# Patient Record
Sex: Female | Born: 1935 | Race: White | Hispanic: No | Marital: Married | Smoking: Never smoker
Health system: Southern US, Community
[De-identification: ages and names within clinical notes are randomized; demographics above are authoritative.]

## PROBLEM LIST (undated history)

## (undated) DIAGNOSIS — E039 Hypothyroidism, unspecified: Secondary | ICD-10-CM

## (undated) DIAGNOSIS — J45909 Unspecified asthma, uncomplicated: Secondary | ICD-10-CM

## (undated) DIAGNOSIS — F0280 Dementia in other diseases classified elsewhere without behavioral disturbance: Secondary | ICD-10-CM

## (undated) DIAGNOSIS — R079 Chest pain, unspecified: Secondary | ICD-10-CM

## (undated) DIAGNOSIS — E785 Hyperlipidemia, unspecified: Secondary | ICD-10-CM

## (undated) HISTORY — PX: ABDOMINAL HYSTERECTOMY: SHX81

## (undated) HISTORY — PX: TONSILLECTOMY: SUR1361

## (undated) HISTORY — DX: Hyperlipidemia, unspecified: E78.5

## (undated) HISTORY — DX: Hypothyroidism, unspecified: E03.9

## (undated) HISTORY — DX: Dementia in other diseases classified elsewhere, unspecified severity, without behavioral disturbance, psychotic disturbance, mood disturbance, and anxiety: F02.80

## (undated) HISTORY — DX: Chest pain, unspecified: R07.9

## (undated) HISTORY — DX: Unspecified asthma, uncomplicated: J45.909

---

## 2004-08-24 ENCOUNTER — Ambulatory Visit: Payer: Self-pay | Admitting: Family Medicine

## 2004-09-18 ENCOUNTER — Ambulatory Visit: Payer: Self-pay | Admitting: Family Medicine

## 2005-03-05 ENCOUNTER — Ambulatory Visit: Payer: Self-pay | Admitting: Family Medicine

## 2005-08-14 ENCOUNTER — Ambulatory Visit: Payer: Self-pay | Admitting: Family Medicine

## 2005-09-10 ENCOUNTER — Ambulatory Visit: Payer: Self-pay | Admitting: Family Medicine

## 2005-10-30 ENCOUNTER — Ambulatory Visit: Payer: Self-pay | Admitting: Family Medicine

## 2016-03-16 DIAGNOSIS — M810 Age-related osteoporosis without current pathological fracture: Secondary | ICD-10-CM

## 2016-03-16 DIAGNOSIS — E782 Mixed hyperlipidemia: Secondary | ICD-10-CM

## 2016-03-16 DIAGNOSIS — F0390 Unspecified dementia without behavioral disturbance: Secondary | ICD-10-CM

## 2016-03-16 DIAGNOSIS — K219 Gastro-esophageal reflux disease without esophagitis: Secondary | ICD-10-CM

## 2016-03-16 DIAGNOSIS — J45909 Unspecified asthma, uncomplicated: Secondary | ICD-10-CM

## 2016-03-16 HISTORY — DX: Age-related osteoporosis without current pathological fracture: M81.0

## 2016-03-16 HISTORY — DX: Mixed hyperlipidemia: E78.2

## 2016-03-16 HISTORY — DX: Unspecified dementia, unspecified severity, without behavioral disturbance, psychotic disturbance, mood disturbance, and anxiety: F03.90

## 2016-03-16 HISTORY — DX: Unspecified asthma, uncomplicated: J45.909

## 2016-03-16 HISTORY — DX: Gastro-esophageal reflux disease without esophagitis: K21.9

## 2016-06-05 DIAGNOSIS — E039 Hypothyroidism, unspecified: Secondary | ICD-10-CM

## 2016-06-05 HISTORY — DX: Hypothyroidism, unspecified: E03.9

## 2016-08-13 DIAGNOSIS — E559 Vitamin D deficiency, unspecified: Secondary | ICD-10-CM | POA: Insufficient documentation

## 2016-08-13 HISTORY — DX: Vitamin D deficiency, unspecified: E55.9

## 2017-06-10 DIAGNOSIS — R079 Chest pain, unspecified: Secondary | ICD-10-CM | POA: Diagnosis not present

## 2018-04-07 DIAGNOSIS — Z131 Encounter for screening for diabetes mellitus: Secondary | ICD-10-CM

## 2018-04-07 HISTORY — DX: Encounter for screening for diabetes mellitus: Z13.1

## 2018-10-13 DIAGNOSIS — Z8249 Family history of ischemic heart disease and other diseases of the circulatory system: Secondary | ICD-10-CM

## 2018-10-13 HISTORY — DX: Family history of ischemic heart disease and other diseases of the circulatory system: Z82.49

## 2019-05-05 DIAGNOSIS — R079 Chest pain, unspecified: Secondary | ICD-10-CM

## 2019-05-05 DIAGNOSIS — G309 Alzheimer's disease, unspecified: Secondary | ICD-10-CM

## 2019-05-05 DIAGNOSIS — K219 Gastro-esophageal reflux disease without esophagitis: Secondary | ICD-10-CM

## 2019-05-05 DIAGNOSIS — E039 Hypothyroidism, unspecified: Secondary | ICD-10-CM

## 2019-05-05 DIAGNOSIS — E785 Hyperlipidemia, unspecified: Secondary | ICD-10-CM | POA: Diagnosis not present

## 2019-05-05 DIAGNOSIS — R9439 Abnormal result of other cardiovascular function study: Secondary | ICD-10-CM | POA: Diagnosis not present

## 2019-05-06 DIAGNOSIS — F419 Anxiety disorder, unspecified: Secondary | ICD-10-CM

## 2019-05-06 DIAGNOSIS — G309 Alzheimer's disease, unspecified: Secondary | ICD-10-CM | POA: Diagnosis not present

## 2019-05-06 DIAGNOSIS — R079 Chest pain, unspecified: Secondary | ICD-10-CM | POA: Diagnosis not present

## 2019-05-06 DIAGNOSIS — K219 Gastro-esophageal reflux disease without esophagitis: Secondary | ICD-10-CM | POA: Diagnosis not present

## 2019-06-03 ENCOUNTER — Ambulatory Visit (INDEPENDENT_AMBULATORY_CARE_PROVIDER_SITE_OTHER): Payer: Medicare HMO | Admitting: Cardiology

## 2019-06-03 ENCOUNTER — Encounter: Payer: Self-pay | Admitting: Cardiology

## 2019-06-03 ENCOUNTER — Other Ambulatory Visit: Payer: Self-pay

## 2019-06-03 VITALS — BP 142/68 | HR 78 | Temp 97.2°F | Ht 64.0 in | Wt 134.4 lb

## 2019-06-03 DIAGNOSIS — I10 Essential (primary) hypertension: Secondary | ICD-10-CM

## 2019-06-03 DIAGNOSIS — E782 Mixed hyperlipidemia: Secondary | ICD-10-CM

## 2019-06-03 DIAGNOSIS — R9439 Abnormal result of other cardiovascular function study: Secondary | ICD-10-CM

## 2019-06-03 DIAGNOSIS — F0390 Unspecified dementia without behavioral disturbance: Secondary | ICD-10-CM | POA: Diagnosis not present

## 2019-06-03 HISTORY — DX: Essential (primary) hypertension: I10

## 2019-06-03 HISTORY — DX: Abnormal result of other cardiovascular function study: R94.39

## 2019-06-03 HISTORY — DX: Mixed hyperlipidemia: E78.2

## 2019-06-03 MED ORDER — ESCITALOPRAM OXALATE 5 MG PO TABS: 5.0000 mg | ORAL_TABLET | Freq: Every day | ORAL | 1 refills | Status: DC

## 2019-06-03 MED ORDER — ATORVASTATIN CALCIUM 40 MG PO TABS: 40.0000 mg | ORAL_TABLET | Freq: Every day | ORAL | 1 refills | Status: DC

## 2019-06-03 MED ORDER — RANOLAZINE ER 500 MG PO TB12: 500.0000 mg | ORAL_TABLET | Freq: Two times a day (BID) | ORAL | 1 refills | Status: DC

## 2019-06-03 NOTE — Progress Notes (Signed)
Cardiology Office Note:    Date:  06/03/2019   ID:  Megan Everett, DOB 08/06/1928, MRN [id number]  PCP:  Olive Bassough, Robert L, MD  Cardiologist:  Garwin Brothersajan R Rommel Hogston, MD   Referring MD: Olive Bassough, Robert L, MD    ASSESSMENT:    1. Abnormal nuclear stress test   2. Essential hypertension   3. Mixed dyslipidemia   4. Dementia without behavioral disturbance, unspecified dementia type (HCC)    PLAN:    In order of problems listed above:  1. Abnormal nuclear stress test: I agree with the fact that she will continue medical therapy for this finding.  She has no other symptoms.  She denies any chest pain.  She had blood work by her primary care physician.  She recently got treated again for pneumonitis. 2. Essential hypertension: Blood pressure stable 3. Mixed dyslipidemia: Diet followed and lipids followed by primary care physician.  She is on statin therapy. 4. Patient will be seen in follow-up appointment in 9 months or earlier if the patient has any concerns    Medication Adjustments/Labs and Tests Ordered: Current medicines are reviewed at length with the patient today.  Concerns regarding medicines are outlined above.  No orders of the defined types were placed in this encounter.  No orders of the defined types were placed in this encounter.    No chief complaint on file.    History of Present Illness:    Megan MartiniMildred B Hazzard is a 85 y.o. female.  Patient is accompanied by family member.  Patient has history of hyperlipidemia.  She has Alzheimer's dementia.  She very pleasant lady.  She came into the hospital because of chest pain and a stress test was mildly abnormal and appropriately medical therapy was recommended.  Subsequently she is done fine.  No chest pain orthopnea or PND.  Her primary care physician does have blood work.  At the time of my evaluation, the patient is alert awake oriented and in no distress.  Past Medical History:  Diagnosis Date   Alzheimer's dementia (HCC)     Asthma    Chest pain    Hyperlipidemia    Hypothyroidism      Current Medications: Current Meds  Medication Sig   albuterol (VENTOLIN HFA) 108 (90 Base) MCG/ACT inhaler Inhale 1-2 puffs into the lungs every 4 (four) hours as needed for wheezing or shortness of breath.   aspirin EC 81 MG tablet Take 81 mg by mouth daily.   atorvastatin (LIPITOR) 40 MG tablet Take 40 mg by mouth daily.   calcium carbonate (OS-CAL - DOSED IN MG OF ELEMENTAL CALCIUM) 1250 (500 Ca) MG tablet Take 1 tablet by mouth 2 (two) times daily with a meal.   Cholecalciferol (VITAMIN D3) 125 MCG (5000 UT) TABS Take 5,000 Units by mouth.   donepezil (ARICEPT) 10 MG tablet Take 10 mg by mouth at bedtime.   escitalopram (LEXAPRO) 5 MG tablet Take 5 mg by mouth daily.   fluticasone (FLOVENT HFA) 110 MCG/ACT inhaler Inhale 1 puff into the lungs 2 (two) times daily.   levothyroxine (SYNTHROID) 25 MCG tablet Take 25 mcg by mouth daily before breakfast.   montelukast (SINGULAIR) 10 MG tablet Take 10 mg by mouth at bedtime.   omeprazole (PRILOSEC) 40 MG capsule Take 40 mg by mouth daily.   ranolazine (RANEXA) 500 MG 12 hr tablet Take 500 mg by mouth 2 (two) times daily.     Allergies:   Patient has no allergy information on record.  Social History   Socioeconomic History   Marital status: Married    Spouse name: Not on file   Number of children: Not on file   Years of education: Not on file   Highest education level: Not on file  Occupational History   Not on file  Social Needs   Financial resource strain: Not on file   Food insecurity    Worry: Not on file    Inability: Not on file   Transportation needs    Medical: Not on file    Non-medical: Not on file  Tobacco Use   Smoking status: Never Smoker   Smokeless tobacco: Never Used  Substance and Sexual Activity   Alcohol use: Not Currently   Drug use: Never   Sexual activity: Not on file  Lifestyle   Physical activity    Days  per week: Not on file    Minutes per session: Not on file   Stress: Not on file  Relationships   Social connections    Talks on phone: Not on file    Gets together: Not on file    Attends religious service: Not on file    Active member of club or organization: Not on file    Attends meetings of clubs or organizations: Not on file    Relationship status: Not on file  Other Topics Concern   Not on file  Social History Narrative   Not on file     Family History: The patient's family history includes Alzheimer's disease in her brother; Cancer in her mother; Cancer - Colon in her brother; Emphysema in her sister; Heart attack in her brother and father; Stroke in her sister.  ROS:   Please see the history of present illness.    All other systems reviewed and are negative.  EKGs/Labs/Other Studies Reviewed:    The following studies were reviewed today: I reviewed records from [facility] extensively including stress test report which was very mildly abnormal.   Recent Labs: No results found for requested labs within last 8760 hours.  Recent Lipid Panel No results found for: CHOL, TRIG, HDL, CHOLHDL, VLDL, LDLCALC, LDLDIRECT  Physical Exam:    VS:  BP (!) 142/68    Pulse 78    Temp (!) 97.2 F (36.2 C)    Ht 5\' 4"  (1.626 m)    Wt 134 lb 6.4 oz (61 kg)    SpO2 98%    BMI 23.07 kg/m     Wt Readings from Last 3 Encounters:  06/03/19 134 lb 6.4 oz (61 kg)     GEN: Patient is in no acute distress HEENT: Normal NECK: No JVD; No carotid bruits LYMPHATICS: No lymphadenopathy CARDIAC: Hear sounds regular, 2/6 systolic murmur at the apex. RESPIRATORY:  Clear to auscultation without rales, wheezing or rhonchi  ABDOMEN: Soft, non-tender, non-distended MUSCULOSKELETAL:  No edema; No deformity  SKIN: Warm and dry NEUROLOGIC:  Alert and oriented x 3 PSYCHIATRIC:  Normal affect   Signed, Jenean Lindau, MD  06/03/2019 4:28 PM    [facility]

## 2019-06-03 NOTE — Patient Instructions (Signed)
Medication Instructions:  Your physician recommends that you continue on your current medications as directed. Please refer to the Current Medication list given to you today.  If you need a refill on your cardiac medications before your next appointment, please call your pharmacy.   Lab work: NOne If you have labs (blood work) drawn today and your tests are completely normal, you will receive your results only by: Marland Kitchen MyChart Message (if you have MyChart) OR . A paper copy in the mail If you have any lab test that is abnormal or we need to change your treatment, we will call you to review the results.  Testing/Procedures: None  Follow-Up: At [facility], you and your health needs are our priority.  As part of our continuing mission to provide you with exceptional heart care, we have created designated Provider Care Teams.  These Care Teams include your primary Cardiologist (physician) and Advanced Practice Providers (APPs -  Physician Assistants and Nurse Practitioners) who all work together to provide you with the care you need, when you need it. You will need a follow up appointment in 9 months.  Please call our office 2 months in advance to schedule this appointment.  You may see No primary care provider on file. or another member of our [employer] Provider Team in [place]: Jenne Campus, MD . Shirlee More, MD  Any Other Special Instructions Will Be Listed Below (If Applicable).

## 2019-06-03 NOTE — Addendum Note (Signed)
Addended by: Particia Nearing B on: 06/03/2019 04:45 PM   Modules accepted: Orders

## 2019-06-10 ENCOUNTER — Ambulatory Visit: Payer: Medicare HMO | Admitting: Cardiology

## 2019-11-19 ENCOUNTER — Other Ambulatory Visit: Payer: Self-pay | Admitting: Cardiology

## 2020-02-19 ENCOUNTER — Other Ambulatory Visit: Payer: Self-pay | Admitting: Cardiology

## 2020-02-22 NOTE — Telephone Encounter (Signed)
You can do her annual exam and atorvastatin provided the patient has been seen in our clinic in the past 3 to 4 months.  If not make sure patient has an appointment.  I do not prescribe Lexapro.

## 2020-02-23 ENCOUNTER — Other Ambulatory Visit: Payer: Self-pay

## 2020-02-23 MED ORDER — RANOLAZINE ER 500 MG PO TB12: 500.0000 mg | ORAL_TABLET | Freq: Two times a day (BID) | ORAL | 0 refills | Status: DC

## 2020-02-23 MED ORDER — ATORVASTATIN CALCIUM 40 MG PO TABS: 40.0000 mg | ORAL_TABLET | Freq: Every day | ORAL | 0 refills | Status: DC

## 2020-02-23 MED ORDER — ESCITALOPRAM OXALATE 5 MG PO TABS: 5.0000 mg | ORAL_TABLET | Freq: Every day | ORAL | 0 refills | Status: DC

## 2020-02-23 NOTE — Telephone Encounter (Signed)
Refill sent to [facility] for Atorvastatin, Escitalopram, and Ranolazine.

## 2020-03-02 ENCOUNTER — Ambulatory Visit: Payer: Medicare HMO | Admitting: Cardiology

## 2020-03-08 ENCOUNTER — Ambulatory Visit: Payer: Medicare HMO | Admitting: Cardiology

## 2020-03-08 ENCOUNTER — Encounter: Payer: Self-pay | Admitting: Cardiology

## 2020-03-08 ENCOUNTER — Other Ambulatory Visit: Payer: Self-pay

## 2020-03-08 VITALS — BP 132/62 | HR 70 | Ht 64.0 in | Wt 124.0 lb

## 2020-03-08 DIAGNOSIS — R9439 Abnormal result of other cardiovascular function study: Secondary | ICD-10-CM

## 2020-03-08 DIAGNOSIS — E782 Mixed hyperlipidemia: Secondary | ICD-10-CM

## 2020-03-08 DIAGNOSIS — I10 Essential (primary) hypertension: Secondary | ICD-10-CM

## 2020-03-08 MED ORDER — RANOLAZINE ER 500 MG PO TB12: 500.0000 mg | ORAL_TABLET | Freq: Two times a day (BID) | ORAL | 3 refills | Status: AC

## 2020-03-08 MED ORDER — ATORVASTATIN CALCIUM 40 MG PO TABS: 40.0000 mg | ORAL_TABLET | Freq: Every day | ORAL | 3 refills | Status: AC

## 2020-03-08 NOTE — Patient Instructions (Signed)

## 2020-03-08 NOTE — Progress Notes (Signed)
Cardiology Office Note:    Date:  03/08/2020   ID:  Megan Everett, DOB [birth year], MRN [id number]  PCP:  Algis Greenhouse, MD  Cardiologist:  Jenean Lindau, MD   Referring MD: Algis Greenhouse, MD    ASSESSMENT:    1. Essential hypertension   2. Abnormal nuclear stress test   3. Mixed dyslipidemia    PLAN:    In order of problems listed above:  1. Abnormal nuclear stress test: Primary prevention stressed with the patient.  Importance of compliance with diet and medication stressed and she vocalized understanding.  She is asymptomatic.  In view of her advanced age we will continue medical therapy. 2. Essential hypertension: Blood pressure stable 3. Mixed dyslipidemia: I reviewed lab work from primary care physician's office in January and discussed with the patient at length 4. Dementia issues: Managed by primary care physician.  Stable. 5. Patient will be seen in follow-up appointment in 6 months or earlier if the patient has any concerns    Medication Adjustments/Labs and Tests Ordered: Current medicines are reviewed at length with the patient today.  Concerns regarding medicines are outlined above.  No orders of the defined types were placed in this encounter.  Meds ordered this encounter  Medications  . ranolazine (RANEXA) 500 MG 12 hr tablet    Sig: Take 1 tablet (500 mg total) by mouth 2 (two) times daily.    Dispense:  180 tablet    Refill:  3  . atorvastatin (LIPITOR) 40 MG tablet    Sig: Take 1 tablet (40 mg total) by mouth daily.    Dispense:  90 tablet    Refill:  3     Chief Complaint  Patient presents with  . Follow-up     History of Present Illness:    Megan Everett is a 86 y.o. female.  Patient has past medical history of abnormal nuclear stress test, essential hypertension and dyslipidemia.  She is accompanied by her daughter.  She has dementia issues.  She denies any chest pain orthopnea or PND.  She appears happy.  At the time of my evaluation,  the patient is alert awake oriented and in no distress.  Past Medical History:  Diagnosis Date  . Alzheimer's dementia ([place])   . Asthma   . Chest pain   . Hyperlipidemia   . Hypothyroidism     Past Surgical History:  Procedure Laterality Date  . ABDOMINAL HYSTERECTOMY    . TONSILLECTOMY      Current Medications: Current Meds  Medication Sig  . albuterol (VENTOLIN HFA) 108 (90 Base) MCG/ACT inhaler Inhale 1-2 puffs into the lungs every 4 (four) hours as needed for wheezing or shortness of breath.  Marland Kitchen aspirin EC 81 MG tablet Take 81 mg by mouth daily.  Marland Kitchen atorvastatin (LIPITOR) 40 MG tablet Take 1 tablet (40 mg total) by mouth daily.  . calcium carbonate (OS-CAL - DOSED IN MG OF ELEMENTAL CALCIUM) 1250 (500 Ca) MG tablet Take 1 tablet by mouth 2 (two) times daily with a meal.  . Cholecalciferol (VITAMIN D3) 125 MCG (5000 UT) TABS Take 5,000 Units by mouth.  . donepezil (ARICEPT) 10 MG tablet Take 10 mg by mouth at bedtime.  Marland Kitchen escitalopram (LEXAPRO) 5 MG tablet Take 1 tablet (5 mg total) by mouth daily.  . fluticasone (FLOVENT HFA) 110 MCG/ACT inhaler Inhale 1 puff into the lungs 2 (two) times daily.  Marland Kitchen levothyroxine (SYNTHROID) 25 MCG tablet Take 25 mcg by  mouth daily before breakfast.  . montelukast (SINGULAIR) 10 MG tablet Take 10 mg by mouth at bedtime.  Marland Kitchen omeprazole (PRILOSEC) 40 MG capsule Take 40 mg by mouth daily.  . ranolazine (RANEXA) 500 MG 12 hr tablet Take 1 tablet (500 mg total) by mouth 2 (two) times daily.  . [DISCONTINUED] atorvastatin (LIPITOR) 40 MG tablet Take 1 tablet (40 mg total) by mouth daily.  . [DISCONTINUED] ranolazine (RANEXA) 500 MG 12 hr tablet Take 1 tablet (500 mg total) by mouth 2 (two) times daily.     Allergies:   Patient has no allergy information on record.   Social History   Socioeconomic History  . Marital status: Married    Spouse name: Not on file  . Number of children: Not on file  . Years of education: Not on file  . Highest  education level: Not on file  Occupational History  . Not on file  Tobacco Use  . Smoking status: Never Smoker  . Smokeless tobacco: Never Used  Substance and Sexual Activity  . Alcohol use: Not Currently  . Drug use: Never  . Sexual activity: Not on file  Other Topics Concern  . Not on file  Social History Narrative  . Not on file   Social Determinants of Health   Financial Resource Strain:   . Difficulty of Paying Living Expenses:   Food Insecurity:   . Worried About Programme researcher, broadcasting/film/video in the Last Year:   . Barista in the Last Year:   Transportation Needs:   . Freight forwarder (Medical):   Marland Kitchen Lack of Transportation (Non-Medical):   Physical Activity:   . Days of Exercise per Week:   . Minutes of Exercise per Session:   Stress:   . Feeling of Stress :   Social Connections:   . Frequency of Communication with Friends and Family:   . Frequency of Social Gatherings with Friends and Family:   . Attends Religious Services:   . Active Member of Clubs or Organizations:   . Attends Banker Meetings:   Marland Kitchen Marital Status:      Family History: The patient's family history includes Alzheimer's disease in her brother; Cancer in her mother; Cancer - Colon in her brother; Emphysema in her sister; Heart attack in her brother and father; Stroke in her sister.  ROS:   Please see the history of present illness.    All other systems reviewed and are negative.  EKGs/Labs/Other Studies Reviewed:    The following studies were reviewed today: I reviewed records from primary care physician office especially lab work from January and discussed with the patient   Recent Labs: No results found for requested labs within last 8760 hours.  Recent Lipid Panel No results found for: CHOL, TRIG, HDL, CHOLHDL, VLDL, LDLCALC, LDLDIRECT  Physical Exam:    VS:  BP 132/62   Pulse 70   Ht 5\' 4"  (1.626 m)   Wt 124 lb (56.2 kg)   SpO2 95%   BMI 21.28 kg/m     Wt  Readings from Last 3 Encounters:  03/08/20 124 lb (56.2 kg)  06/03/19 134 lb 6.4 oz (61 kg)     GEN: Patient is in no acute distress HEENT: Normal NECK: No JVD; No carotid bruits LYMPHATICS: No lymphadenopathy CARDIAC: Hear sounds regular, 2/6 systolic murmur at the apex. RESPIRATORY:  Clear to auscultation without rales, wheezing or rhonchi  ABDOMEN: Soft, non-tender, non-distended MUSCULOSKELETAL:  No edema; No  deformity  SKIN: Warm and dry NEUROLOGIC:  Alert and oriented x 3 PSYCHIATRIC:  Normal affect   Signed, Garwin Brothers, MD  03/08/2020 10:51 AM    [facility]

## 2020-07-14 ENCOUNTER — Emergency Department (HOSPITAL_COMMUNITY)
Admission: EM | Admit: 2020-07-14 | Discharge: 2020-07-14 | Disposition: A | Discharge status: Home / Self Care | Payer: Medicare HMO | Attending: Emergency Medicine | Admitting: Emergency Medicine

## 2020-07-14 ENCOUNTER — Emergency Department (HOSPITAL_COMMUNITY): Payer: Medicare HMO

## 2020-07-14 DIAGNOSIS — J45909 Unspecified asthma, uncomplicated: Secondary | ICD-10-CM | POA: Diagnosis not present

## 2020-07-14 DIAGNOSIS — E039 Hypothyroidism, unspecified: Secondary | ICD-10-CM | POA: Insufficient documentation

## 2020-07-14 DIAGNOSIS — R251 Tremor, unspecified: Secondary | ICD-10-CM | POA: Insufficient documentation

## 2020-07-14 DIAGNOSIS — Z79890 Hormone replacement therapy (postmenopausal): Secondary | ICD-10-CM | POA: Insufficient documentation

## 2020-07-14 DIAGNOSIS — F0390 Unspecified dementia without behavioral disturbance: Secondary | ICD-10-CM | POA: Insufficient documentation

## 2020-07-14 DIAGNOSIS — Z7982 Long term (current) use of aspirin: Secondary | ICD-10-CM | POA: Diagnosis not present

## 2020-07-14 DIAGNOSIS — Z7983 Long term (current) use of bisphosphonates: Secondary | ICD-10-CM | POA: Diagnosis not present

## 2020-07-14 DIAGNOSIS — R259 Unspecified abnormal involuntary movements: Secondary | ICD-10-CM | POA: Diagnosis present

## 2020-07-14 LAB — COMPREHENSIVE METABOLIC PANEL
ALT: 13 U/L (ref 0–44)
AST: 20 U/L (ref 15–41)
Albumin: 3.8 g/dL (ref 3.5–5.0)
Alkaline Phosphatase: 112 U/L (ref 38–126)
Anion gap: 9 (ref 5–15)
BUN: 11 mg/dL (ref 8–23)
CO2: 29 mmol/L (ref 22–32)
Calcium: 9.5 mg/dL (ref 8.9–10.3)
Chloride: 99 mmol/L (ref 98–111)
Creatinine, Ser: 0.99 mg/dL (ref 0.44–1.00)
GFR calc Af Amer: 58 mL/min — ABNORMAL LOW (ref >60)
GFR calc non Af Amer: 50 mL/min — ABNORMAL LOW (ref >60)
Glucose, Bld: 106 mg/dL — ABNORMAL HIGH (ref 70–99)
Potassium: 5 mmol/L (ref 3.5–5.1)
Sodium: 137 mmol/L (ref 135–145)
Total Bilirubin: 0.9 mg/dL (ref 0.3–1.2)
Total Protein: 7.2 g/dL (ref 6.5–8.1)

## 2020-07-14 LAB — CBC WITH DIFFERENTIAL/PLATELET
Abs Immature Granulocytes: 0.03 10*3/uL (ref 0.00–0.07)
Basophils Absolute: 0 10*3/uL (ref 0.0–0.1)
Basophils Relative: 1 %
Eosinophils Absolute: 0.1 10*3/uL (ref 0.0–0.5)
Eosinophils Relative: 1 %
HCT: 40.1 % (ref 36.0–46.0)
Hemoglobin: 12.9 g/dL (ref 12.0–15.0)
Immature Granulocytes: 0 %
Lymphocytes Relative: 29 %
Lymphs Abs: 2.5 10*3/uL (ref 0.7–4.0)
MCH: 30.6 pg (ref 26.0–34.0)
MCHC: 32.2 g/dL (ref 30.0–36.0)
MCV: 95 fL (ref 80.0–100.0)
Monocytes Absolute: 0.7 10*3/uL (ref 0.1–1.0)
Monocytes Relative: 8 %
Neutro Abs: 5.1 10*3/uL (ref 1.7–7.7)
Neutrophils Relative %: 61 %
Platelets: 264 10*3/uL (ref 150–400)
RBC: 4.22 MIL/uL (ref 3.87–5.11)
RDW: 12.7 % (ref 11.5–15.5)
WBC: 8.5 10*3/uL (ref 4.0–10.5)
nRBC: 0 % (ref 0.0–0.2)

## 2020-07-14 LAB — TSH: TSH: 2.085 u[IU]/mL (ref 0.350–4.500)

## 2020-07-14 LAB — MAGNESIUM: Magnesium: 1.9 mg/dL (ref 1.7–2.4)

## 2020-07-14 MED ORDER — PROPRANOLOL HCL 10 MG PO TABS
10.0000 mg | ORAL_TABLET | ORAL | Status: AC
  Administered 2020-07-14: 10 mg via ORAL
  Filled 2020-07-14: qty 1

## 2020-07-14 MED ORDER — PROPRANOLOL HCL 10 MG PO TABS
10.0000 mg | ORAL_TABLET | Freq: Three times a day (TID) | ORAL | 0 refills | Status: DC | PRN
Start: 2020-07-14 — End: 2020-09-06

## 2020-07-14 NOTE — ED Triage Notes (Signed)
Pt brought in by [facility] d/t family reporting pt had a sudden onset of jerking motions that was not a seizure-like (per family) & reports no LOC during the movements happening.

## 2020-07-14 NOTE — ED Notes (Signed)
Patient is resting comfortably. 

## 2020-07-14 NOTE — Discharge Instructions (Addendum)
Your urinalysis does not show any significant signs of infection.  If you have no longer recommend.  I did obtain a urine culture of whether there is infection in your urine in several days.  I discussed the case with our neurologist who recommended medication called propranolol.  This will help with the tremors.  Please drink plenty of water.  Propanolol can make you feel lightheaded or dizzy.  If you have a reaction to the medication you may stop taking medication.  Prescribing you a low dose which is 10 mg you may use as needed for the tremors every 8 hours.

## 2020-07-14 NOTE — ED Provider Notes (Signed)
[facility] EMERGENCY DEPARTMENT Provider Note   CSN: [id number] Arrival date & time: 07/14/20  1127     History Chief Complaint  Patient presents with  . Involunary Movements    Megan Everett is a 86 y.o. female.  HPI Patient is a 86 year old female with a history of Alzheimer's dementia, asthma, hyperlipidemia, hypothyroidism on medications for these conditions.  She is presenting today for   I discussed with patient's daughter Jaina Morin.  Confirmed that patient symptoms started today prior.  Patient is currently staying with her son. No fevers/chills.  Daughter states that she has not had any other significant symptoms.  She states that her dementia is reasonably consistent but she states that she has good days and bad days. States she seemed to be having a good day today.   Patient denies any associated symptoms, aggravating or mitigating factors, she has tried no medications prior to arrival.  She has not had symptoms such as this in the past.     Past Medical History:  Diagnosis Date  . Alzheimer's dementia (HCC)   . Asthma   . Chest pain   . Hyperlipidemia   . Hypothyroidism     Patient Active Problem List   Diagnosis Date Noted  . Abnormal nuclear stress test 06/03/2019  . Essential hypertension 06/03/2019  . Mixed dyslipidemia 06/03/2019  . Dementia (HCC) 06/03/2019    Past Surgical History:  Procedure Laterality Date  . ABDOMINAL HYSTERECTOMY    . TONSILLECTOMY       OB History   No obstetric history on file.     Family History  Problem Relation Age of Onset  . Cancer Mother   . Heart attack Father   . Emphysema Sister   . Heart attack Brother   . Cancer - Colon Brother   . Alzheimer's disease Brother   . Stroke Sister     Social History   Tobacco Use  . Smoking status: Never Smoker  . Smokeless tobacco: Never Used  Vaping Use  . Vaping Use: Never used  Substance Use Topics  . Alcohol use: Not Currently  . Drug  use: Never    Home Medications Prior to Admission medications   Medication Sig Start Date End Date Taking? Authorizing Provider  albuterol (VENTOLIN HFA) 108 (90 Base) MCG/ACT inhaler Inhale 1-2 puffs into the lungs every 4 (four) hours as needed for wheezing or shortness of breath.    [provider]  aspirin EC 81 MG tablet Take 81 mg by mouth daily.    [provider]  atorvastatin (LIPITOR) 40 MG tablet Take 1 tablet (40 mg total) by mouth daily. 03/08/20   Revankar, Aundra Dubin, MD  calcium carbonate (OS-CAL - DOSED IN MG OF ELEMENTAL CALCIUM) 1250 (500 Ca) MG tablet Take 1 tablet by mouth 2 (two) times daily with a meal.    [provider]  Cholecalciferol (VITAMIN D3) 125 MCG (5000 UT) TABS Take 5,000 Units by mouth.    [provider]  donepezil (ARICEPT) 10 MG tablet Take 10 mg by mouth at bedtime.    [provider]  escitalopram (LEXAPRO) 5 MG tablet Take 1 tablet (5 mg total) by mouth daily. 02/23/20   Revankar, Aundra Dubin, MD  fluticasone (FLOVENT HFA) 110 MCG/ACT inhaler Inhale 1 puff into the lungs 2 (two) times daily.    [provider]  levothyroxine (SYNTHROID) 25 MCG tablet Take 25 mcg by mouth daily before breakfast.    [provider]  montelukast (SINGULAIR) 10 MG tablet Take 10 mg by mouth at bedtime.    [provider]  omeprazole (PRILOSEC) 40 MG capsule Take 40 mg by mouth daily.    [provider]  propranolol (INDERAL) 10 MG tablet Take 1 tablet (10 mg total) by mouth every 8 (eight) hours as needed for up to 14 days (for tremors). 07/14/20 07/28/20  Gailen Shelter, PA  ranolazine (RANEXA) 500 MG 12 hr tablet Take 1 tablet (500 mg total) by mouth 2 (two) times daily. 03/08/20   Revankar, Aundra Dubin, MD    Allergies    Patient has no allergy information on record.  Review of Systems   Review of Systems  Constitutional: Negative for chills and fever.  HENT: Negative for congestion.   Eyes:  Negative for pain.  Respiratory: Negative for cough and shortness of breath.   Cardiovascular: Negative for chest pain and leg swelling.  Gastrointestinal: Negative for abdominal distention, abdominal pain and vomiting.  Genitourinary: Negative for dysuria.  Musculoskeletal: Negative for myalgias.  Skin: Negative for rash.  Neurological: Negative for dizziness and headaches.       Involuntary shaking    Physical Exam Updated Vital Signs BP 124/60   Pulse 79   Temp 98.2 F (36.8 C) (Oral)   Resp 18   SpO2 100%   Physical Exam Vitals and nursing note reviewed.  Constitutional:      General: She is not in acute distress.    Comments: Pleasant well-appearing 86 year old female in no acute distress.  HENT:     Head: Normocephalic and atraumatic.     Nose: Nose normal.  Eyes:     General: No scleral icterus. Cardiovascular:     Rate and Rhythm: Normal rate and regular rhythm.     Pulses: Normal pulses.     Heart sounds: Normal heart sounds.  Pulmonary:     Effort: Pulmonary effort is normal. No respiratory distress.     Breath sounds: No wheezing.  Abdominal:     Palpations: Abdomen is soft.     Tenderness: There is no abdominal tenderness. There is no guarding or rebound.  Musculoskeletal:     Cervical back: Normal range of motion.     Right lower leg: No edema.     Left lower leg: No edema.  Skin:    General: Skin is warm and dry.     Capillary Refill: Capillary refill takes less than 2 seconds.  Neurological:     Mental Status: She is alert.     Comments: Alert and oriented to self, place, time and event.   Patient is shaking forwards and backwards low frequency and amplitude. Arms without shaking until they are brought up for drift assessment. No drift but shaking is present in both arms symmetrically.  Speech is fluent, clear without dysarthria or dysphasia.   Strength 5/5 in upper/lower extremities  Sensation intact in upper/lower extremities   Negative  Romberg. No pronator drift.  Normal finger-to-nose and feet tapping.  CN I not tested  CN II grossly intact visual fields bilaterally. Did not visualize posterior eye.   CN III, IV, VI PERRLA and EOMs intact bilaterally  CN V Intact sensation to sharp and light touch to the face  CN VII facial movements symmetric  CN VIII not tested  CN IX, X no uvula deviation, symmetric rise of soft palate  CN XI 5/5 SCM and trapezius strength bilaterally  CN XII Midline tongue protrusion, symmetric L/R  movements   Psychiatric:        Mood and Affect: Mood normal.        Behavior: Behavior normal.     ED Results / Procedures / Treatments   Labs (all labs ordered are listed, but only abnormal results are displayed) Labs Reviewed  COMPREHENSIVE METABOLIC PANEL - Abnormal; Notable for the following components:      Result Value   Glucose, Bld 106 (*)    GFR calc non Af Amer 50 (*)    GFR calc Af Amer 58 (*)    All other components within normal limits  CBC WITH DIFFERENTIAL/PLATELET  MAGNESIUM  TSH    EKG None  Radiology CT Head Wo Contrast  Result Date: 07/14/2020 CLINICAL DATA:  Headache. EXAM: CT HEAD WITHOUT CONTRAST TECHNIQUE: Contiguous axial images were obtained from the base of the skull through the vertex without intravenous contrast. COMPARISON:  11/12/2012 FINDINGS: Brain: Mild cerebral atrophy is appropriate for age. Low-density throughout the periventricular and subcortical white matter is suggestive for chronic changes. No evidence for acute hemorrhage, mass lesion, midline shift, hydrocephalus or large infarct. Vascular: No hyperdense vessel or unexpected calcification. Skull: Normal. Negative for fracture or focal lesion. Sinuses/Orbits: Visualized paranasal sinuses are clear. Mastoid air cells are clear. Other: None IMPRESSION: 1. No acute intracranial abnormality. 2. Low-density in the white matter is suggestive for chronic small vessel ischemic changes. Electronically Signed    By: Richarda OverlieAdam  Henn M.D.   On: 07/14/2020 13:53    Procedures Procedures (including critical care time)  Medications Ordered in ED Medications  propranolol (INDERAL) tablet 10 mg (10 mg Oral Given 07/14/20 1621)    ED Course  I have reviewed the triage vital signs and the nursing notes.  Pertinent labs & imaging results that were available during my care of the patient were reviewed by me and considered in my medical decision making (see chart for details).  Patient is a 86 year old female with past medical history detailed above presented today with bilateral arm shaking and some head bobbing.  Physical exam is unremarkable apart from the bilateral arm shaking which is high frequency low amplitude.  It is symmetric.  She has no dizziness or neurologic deficits.  She has no other complaints and no other notable abnormalities on physical exam.  We will obtain basic lab work as well as mag, TSH and a head CT.  Clinical Course as of Jul 15 2319  Thu Jul 14, 2020  1529 TSH within normal limits.  Magnesium within normal limits.  CMP without any electrolyte abnormalities.  Kidney function at baseline.  CBC without leukocytosis or anemia.  CT head without any acute abnormality.  There is evidence of chronic microvascular changes.   [WF]    Clinical Course User Index [WF] Gailen ShelterFondaw, Vannessa [place]own S, [place]    MDM Rules/Calculators/A&P                          Discussed with Dr. Thomasena Edisollins of on-call neurology who recommends propranolol.  She will follow up with neurology.  Her and daughter are aware of plan and agreeable.  I reviewed patient's most recent lab work and his recent urinalysis appointment 04/20/20.  Primary issue or hypothyroidism seems to be well-controlled with current regimen.  Mild persistent asthma palpitation seems to be well controlled.  Alzheimer's dementia progressing as expected.  No medication changes were made.  She is on donepezil.  I discussed this case with my attending physician  who cosigned this note including patient's presenting symptoms, physical exam, and planned diagnostics and interventions. Attending physician stated agreement with plan or made changes to plan which were implemented. Attending physician assessed patient at bedside. The medical records were personally reviewed by myself. I personally reviewed all lab results and interpreted all imaging studies and either concurred with their official read or contacted radiology for clarification. Additional history obtained from old records and family members.  This patient appears reasonably screened and I doubt any other medical condition requiring further workup, evaluation, or treatment in the ED at this time prior to discharge.   Patient's vitals are WNL apart from vital sign abnormalities discussed above, patient is in NAD, and able to ambulate in the ED at their baseline and able to tolerate PO.  Pain has been managed or a plan has been made for home management and has no complaints prior to discharge. Patient is comfortable with above plan and for discharge at this time. All questions were answered prior to disposition. Results from the ER workup discussed with the patient face to face and all questions answered to the best of my ability. The patient is safe for discharge with strict return precautions. Patient appears safe for discharge with appropriate follow-up. Conveyed my impression with the patient and they voiced understanding and are agreeable to plan.   An After Visit Summary was printed and given to the patient.  Portions of this note were generated with Scientist, clinical (histocompatibility and immunogenetics). Dictation errors may occur despite best attempts at proofreading.    Final Clinical Impression(s) / ED Diagnoses Final diagnoses:  Tremor    Rx / DC Orders ED Discharge Orders         Ordered    propranolol (INDERAL) 10 MG tablet  Every 8 hours PRN        07/14/20 1558           [place], [place] 07/14/20  2320    Bethann Berkshire, MD 07/17/20 0715

## 2020-07-15 ENCOUNTER — Encounter: Payer: Self-pay | Admitting: Neurology

## 2020-07-18 NOTE — Progress Notes (Signed)
Assessment/Plan:   1.  Episodes of weakness (generalized), increased confusion and tremulousness  -Discussed with patient and caregiver that I am not sure we will find an answer to these, without being able to capture the episodes, but complex partial seizure is within the differential.  -we will do an EEG  -if neg an ambulatory 48 hour EEG will be performed.  -Patient is claustrophobic, and we decided to hold off on MRI for now.  I am not sure the risk-benefit ratio favors sedating her for MRI.  She did just have a CT brain.  -Did discuss with patient and family that I would recommend her having overall medical evaluation to make sure that she is not having cardiac arrhythmias leading to these events.  -Did discussion with patient and family that sometimes patients who have Alzheimer's disease will have periods of increased confusion, but I am not necessarily sure that is what is going on here.  I did not see any evidence of tremor today.  I certainly do not think that this represents essential tremor, for which she was prescribed propranolol in the emergency room.   Subjective:   INDEA DEARMAN was seen in consultation in the movement disorder clinic at the request of Dough, Doris Cheadle, MD.  Patient with daughter in law who supplements the history.  Patient is a 86 year old female with a history of Alzheimer's dementia (on Aricept), hyperlipidemia, hypothyroidism who presents today for the evaluation of tremor.  Patient was seen in the emergency room on July 14, 2020 for tremor.  Patient was started on propranolol, 10 mg 3 times per day, by the emergency room.  In ER records, son reported that they went to the emergency room because of acute onset and arm shaking and head bobbing.  Emergency room records indicate that patient's arms were not shaking until they were brought forward for drift assessment.  Daughter in law states that she had never seen this previously but she had one other event  Saturday evening around 7pm.  She came out of the bathroom.  Patient became off balance  And her eyes looked "out of it."  She was saying something but daughter in law couldn't understand her.  Daughter in law took her socks off and pt couldn't help with that.  Pt became nauseated.  Pt was weak as if she hand no control of either hands - they seemed uncoordinated.    With the ER event, the patient could talk but her jaw and voice would "quiver" and her arms seemed "all over the place."    Pt lives with daughter in law and her husband for the last few years and prior to that her great grandson lived with her.  At baseline, she gets help in and out of the shower but bathes herself and dresses herself.  Pills are administered to her.  Personality/mood/behavior are good.  Pt generally sleeps until 11am.  She works some puzzles.  She doesn't communicate much but will some.  She wears undergarments for bowels but mostly makes it to BR for bladder.    RE:  Propranolol, only gave 1 dose as they were told it was prn.  They only gave it Saturday night when she felt weak again.     Current/Previously tried tremor medications: Propranolol, 10 mg 3 times per day  Current medications that may exacerbate tremor:  Albuterol  Outside reports reviewed: ER records, historical medical records, office notes and referral letter/letters.  CT brain was completed on  July 14, 2020 and personally reviewed.  There was evidence of small vessel disease, but it was otherwise nonacute.  No Known Allergies  Current Outpatient Medications  Medication Instructions  . albuterol (VENTOLIN HFA) 108 (90 Base) MCG/ACT inhaler 1-2 puffs, Every 4 hours PRN  . aspirin EC 81 mg, Daily  . atorvastatin (LIPITOR) 40 mg, Oral, Daily  . calcium carbonate (OS-CAL - DOSED IN MG OF ELEMENTAL CALCIUM) 1250 (500 Ca) MG tablet 1 tablet, Oral, 2 times daily with meals  . donepezil (ARICEPT) 10 mg, Oral, Daily at bedtime  . escitalopram  (LEXAPRO) 5 mg, Oral, Daily  . fluticasone (FLOVENT HFA) 110 MCG/ACT inhaler 1 puff, Inhalation, 2 times daily  . levothyroxine (SYNTHROID) 25 mcg, Oral, Daily before breakfast  . montelukast (SINGULAIR) 10 mg, Oral, Daily at bedtime  . omeprazole (PRILOSEC) 40 mg, Oral, Daily  . propranolol (INDERAL) 10 mg, Oral, Every 8 hours PRN  . ranolazine (RANEXA) 500 mg, Oral, 2 times daily  . Vitamin D3 5,000 Units, Oral     Objective:   VITALS:   Vitals:   07/21/20 0837  BP: (!) 161/80  Pulse: 87  Resp: 18  SpO2: 98%  Weight: 125 lb (56.7 kg)  Height: 5\' 3"  (1.6 m)   Gen:  Appears stated age and in NAD. HEENT:  Normocephalic, atraumatic. The mucous membranes are moist. The superficial temporal arteries are without ropiness or tenderness. Cardiovascular: Regular rate and rhythm. Lungs: Clear to auscultation bilaterally. Neck: There are no carotid bruits noted bilaterally.  NEUROLOGICAL:  Orientation:  The patient is alert and oriented to person.  Follows commands well Cranial nerves: There is good facial symmetry. Extraocular muscles are intact and visual fields are full to confrontational testing. Speech is fluent and clear. Soft palate rises symmetrically and there is no tongue deviation. Hearing is intact to conversational tone. Tone: Tone is good throughout. Sensation: Sensation is intact to light touch touch throughout (facial, trunk, extremities). Vibration is intact at the bilateral big toe. There is no extinction with double simultaneous stimulation. There is no sensory dermatomal level identified. Coordination:  The patient has no dysdiadichokinesia or dysmetria. Motor: Strength is 5/5 in the bilateral upper and lower extremities.  Shoulder shrug is equal bilaterally.  There is no pronator drift.  There are no fasciculations noted. DTR's: Deep tendon reflexes are 2-/4 at the bilateral biceps, triceps, brachioradialis, patella and achilles.  Plantar responses are downgoing  bilaterally. Gait and Station: The patient is able to ambulate without difficulty. The patient is able to heel toe walk without any difficulty. The patient is able to ambulate in a tandem fashion. The patient is able to stand in the Romberg position.   MOVEMENT EXAM: Tremor:  There is no tremor in the UE, noted most significantly with action.  There is no tremor at rest.    I have reviewed and interpreted the following labs independently   Chemistry      Component Value Date/Time   NA 137 07/14/2020 1433   K 5.0 07/14/2020 1433   CL 99 07/14/2020 1433   CO2 29 07/14/2020 1433   BUN 11 07/14/2020 1433   CREATININE 0.99 07/14/2020 1433      Component Value Date/Time   CALCIUM 9.5 07/14/2020 1433   ALKPHOS 112 07/14/2020 1433   AST 20 07/14/2020 1433   ALT 13 07/14/2020 1433   BILITOT 0.9 07/14/2020 1433      Lab Results  Component Value Date   WBC 8.5 07/14/2020  HGB 12.9 07/14/2020   HCT 40.1 07/14/2020   MCV 95.0 07/14/2020   PLT 264 07/14/2020   Lab Results  Component Value Date   TSH 2.085 07/14/2020      Total time spent on today's visit was 45 minutes, including both face-to-face time and nonface-to-face time.  Time included that spent on review of records (prior notes available to me/labs/imaging if pertinent), discussing treatment and goals, answering patient's questions and coordinating care.  CC:  Dough, Doris Cheadle, MD

## 2020-07-21 ENCOUNTER — Ambulatory Visit: Payer: Medicare HMO | Admitting: Neurology

## 2020-07-21 ENCOUNTER — Other Ambulatory Visit: Payer: Self-pay

## 2020-07-21 ENCOUNTER — Encounter: Payer: Self-pay | Admitting: Neurology

## 2020-07-21 VITALS — BP 161/80 | HR 87 | Resp 18 | Ht 63.0 in | Wt 125.0 lb

## 2020-07-21 DIAGNOSIS — R404 Transient alteration of awareness: Secondary | ICD-10-CM

## 2020-07-21 DIAGNOSIS — G301 Alzheimer's disease with late onset: Secondary | ICD-10-CM | POA: Diagnosis not present

## 2020-07-21 DIAGNOSIS — F0280 Dementia in other diseases classified elsewhere without behavioral disturbance: Secondary | ICD-10-CM

## 2020-07-25 ENCOUNTER — Other Ambulatory Visit: Payer: Self-pay

## 2020-07-25 ENCOUNTER — Ambulatory Visit: Payer: Medicare HMO | Admitting: Neurology

## 2020-07-25 DIAGNOSIS — R404 Transient alteration of awareness: Secondary | ICD-10-CM

## 2020-07-25 NOTE — Procedures (Signed)
TECHNICAL SUMMARY:  A multichannel referential and bipolar montage EEG using the standard international 10-20 system was performed on the patient described as awake, drowsy, and asleep.  The dominant background activity consists of normal 8-9 hertz activity seen most prominantly over the posterior head region.  The backgound activity is reactive to eye opening and closing procedures.  Low voltage fast (beta) activity is distributed symmetrically and maximally over the anterior head regions.  ACTIVATION:  Stepwise photic stimulation at 4-20 flashes per second was performed and did not elicit any abnormal waveforms.  Hyperventilation was not performed.  EPILEPTIFORM ACTIVITY:  There were no spikes, sharp waves or paroxysmal activity.  SLEEP: Stage I and stage II sleep architecture are identified.   IMPRESSION:  This is a normal EEG for the patients stated age.  There were no focal, hemispheric or lateralizing features.  No epileptiform activity was recorded.  A normal EEG does not exclude the diagnosis of a seizure disorder and if seizure remains high on the list of differential diagnosis, an ambulatory EEG may be of value.  Clinical correlation is required.

## 2020-08-19 ENCOUNTER — Other Ambulatory Visit: Payer: Self-pay | Admitting: Cardiology

## 2020-08-23 ENCOUNTER — Ambulatory Visit: Payer: Medicare HMO | Admitting: Neurology

## 2020-08-23 ENCOUNTER — Other Ambulatory Visit: Payer: Self-pay

## 2020-08-23 DIAGNOSIS — R404 Transient alteration of awareness: Secondary | ICD-10-CM

## 2020-09-01 ENCOUNTER — Telehealth: Payer: Self-pay | Admitting: Neurology

## 2020-09-01 NOTE — Telephone Encounter (Signed)
Let pt/daughter know that pts 48 hour eeg was completely normal.  I would not add any medication

## 2020-09-01 NOTE — Procedures (Signed)
ELECTROENCEPHALOGRAM REPORT  Dates of Recording: 08/23/2020 12:02PM to 08/25/2020 12:02PM Patient's Name: Megan Everett MRN: [id number] Date of Birth: 1928/07/23  Referring Provider: Dr. Lurena Joiner Tat  Procedure: 48-hour ambulatory video EEG  History: This is a 86 year old woman with acute onset head bobbing, arm shaking, incoordination, confusion. EEG for classification.   Medications:  VENTOLIN HFA 108 (90 Base) MCG/ACT inhaler aspirin EC 81 mg LIPITOR 40 mg OS-CAL - DOSED IN MG OF ELEMENTAL CALCIUM 1250 (500 Ca) MG tablet ARICEPT 10 mg FLOVENT HFA 110 MCG/ACT inhaler SYNTHROID 25 mcg SINGULAIR 10 mg PRILOSEC 40 mg INDERAL 10 mg RANEXA 500 mg Vitamin D3 5,000 Units   Technical Summary: This is a 48-hour multichannel digital video EEG recording measured by the international 10-20 system with electrodes applied with paste and impedances below 5000 ohms performed as portable with EKG monitoring.  The digital EEG was referentially recorded, reformatted, and digitally filtered in a variety of bipolar and referential montages for optimal display.    DESCRIPTION OF RECORDING: During maximal wakefulness, the background activity consisted of a symmetric 8 Hz posterior dominant rhythm which was reactive to eye opening.  There were no epileptiform discharges or focal slowing seen in wakefulness.  During the recording, the patient progresses through wakefulness, drowsiness, and Stage 2 sleep.  Again, there were no epileptiform discharges seen.  Events: There were no push button events.   There were no electrographic seizures seen.  EKG lead was unremarkable.  IMPRESSION: This 48-hour ambulatory video EEG study is normal.    CLINICAL CORRELATION: A normal EEG does not exclude a clinical diagnosis of epilepsy. Typical events were not captured. If further clinical questions remain, inpatient video EEG monitoring may be helpful.   Patrcia Dolly, M.D.

## 2020-09-01 NOTE — Telephone Encounter (Signed)
Patients daughter, Eather Colas, notified and voiced understanding.

## 2020-09-05 DIAGNOSIS — F0280 Dementia in other diseases classified elsewhere without behavioral disturbance: Secondary | ICD-10-CM | POA: Insufficient documentation

## 2020-09-05 DIAGNOSIS — R079 Chest pain, unspecified: Secondary | ICD-10-CM | POA: Insufficient documentation

## 2020-09-06 ENCOUNTER — Other Ambulatory Visit: Payer: Self-pay

## 2020-09-06 ENCOUNTER — Ambulatory Visit: Payer: Medicare HMO | Admitting: Cardiology

## 2020-09-06 ENCOUNTER — Encounter: Payer: Self-pay | Admitting: Cardiology

## 2020-09-06 VITALS — BP 134/74 | HR 84 | Ht 63.0 in | Wt 126.2 lb

## 2020-09-06 DIAGNOSIS — R9439 Abnormal result of other cardiovascular function study: Secondary | ICD-10-CM | POA: Diagnosis not present

## 2020-09-06 DIAGNOSIS — E039 Hypothyroidism, unspecified: Secondary | ICD-10-CM

## 2020-09-06 DIAGNOSIS — E782 Mixed hyperlipidemia: Secondary | ICD-10-CM

## 2020-09-06 DIAGNOSIS — I10 Essential (primary) hypertension: Secondary | ICD-10-CM

## 2020-09-06 NOTE — Patient Instructions (Signed)

## 2020-09-06 NOTE — Progress Notes (Signed)
Cardiology Office Note:    Date:  09/06/2020   ID:  Megan Everett, DOB 05-31-1928, MRN [id number]  PCP:  Olive Bass, MD  Cardiologist:  Garwin Brothers, MD   Referring MD: Olive Bass, MD    ASSESSMENT:    1. Essential hypertension   2. Hyperlipidemia, mixed   3. Abnormal nuclear stress test    PLAN:    In order of problems listed above:  1. Primary prevention stressed with the patient.  Importance of compliance with diet medication stressed vocalized understanding.  She ambulates age appropriately. 2. Abnormal nuclear stress test: Medical management at this time.  I inquired with the patient to see if she has any symptoms with activities of daily living and she answered in the negative.  She will continue current medications. 3. Mixed dyslipidemia: On statin therapy.  She is fasting and will do complete blood work today and send copy to primary care physician 4. Patient will be seen in follow-up appointment in 6 months or earlier if the patient has any concerns    Medication Adjustments/Labs and Tests Ordered: Current medicines are reviewed at length with the patient today.  Concerns regarding medicines are outlined above.  No orders of the defined types were placed in this encounter.  No orders of the defined types were placed in this encounter.    No chief complaint on file.    History of Present Illness:    Megan Everett is a 86 y.o. female.  Patient has past medical history of abnormal stress test, essential hypertension dyslipidemia and dementia.  She denies any problems at this time and takes care of activities of daily living.  She ambulates age appropriately at home.  She lives with her daughter and her daughter accompanies her for this visit.  At the time of my evaluation, the patient is alert awake oriented and in no distress.   Past Medical History:  Diagnosis Date  . Abnormal nuclear stress test 06/03/2019  . Acquired hypothyroidism 06/05/2016  .  Alzheimer's dementia (HCC)   . Asthma   . Chest pain   . Dementia (HCC) 03/16/2016   Formatting of this note might be different from the original. (2014) MMSE 26 (2015) MMSE 22 2019: MMSE 22  . Essential hypertension 06/03/2019  . Family history of premature coronary artery disease 10/13/2018   Formatting of this note might be different from the original. brother  . GERD (gastroesophageal reflux disease) 03/16/2016  . Hyperlipidemia   . Hyperlipidemia, mixed 03/16/2016  . Hypothyroidism   . LPRD (laryngopharyngeal reflux disease) 03/16/2016  . Mixed dyslipidemia 06/03/2019  . Osteoporosis 03/16/2016  . Screening for diabetes mellitus (DM) 04/07/2018   Formatting of this note might be different from the original. 2019: prediabetes resolved  . Vitamin D deficiency 08/13/2016    Past Surgical History:  Procedure Laterality Date  . ABDOMINAL HYSTERECTOMY    . TONSILLECTOMY      Current Medications: Current Meds  Medication Sig  . albuterol (VENTOLIN HFA) 108 (90 Base) MCG/ACT inhaler Inhale 1-2 puffs into the lungs every 4 (four) hours as needed for wheezing or shortness of breath.   Marland Kitchen atorvastatin (LIPITOR) 40 MG tablet Take 1 tablet (40 mg total) by mouth daily.  . calcium carbonate (OS-CAL - DOSED IN MG OF ELEMENTAL CALCIUM) 1250 (500 Ca) MG tablet Take 1 tablet by mouth 2 (two) times daily with a meal.  . Cholecalciferol (VITAMIN D3) 125 MCG (5000 UT) TABS Take 5,000 Units  by mouth.  . donepezil (ARICEPT) 10 MG tablet Take 10 mg by mouth at bedtime.  Marland Kitchen escitalopram (LEXAPRO) 5 MG tablet Take 1 tablet (5 mg total) by mouth daily.  . fluticasone (FLOVENT HFA) 110 MCG/ACT inhaler Inhale 1 puff into the lungs 2 (two) times daily.  Marland Kitchen levothyroxine (SYNTHROID) 25 MCG tablet Take 25 mcg by mouth daily before breakfast.  . montelukast (SINGULAIR) 10 MG tablet Take 10 mg by mouth at bedtime.  Marland Kitchen omeprazole (PRILOSEC) 40 MG capsule Take 40 mg by mouth daily.  . ranolazine (RANEXA) 500 MG 12 hr tablet  Take 1 tablet (500 mg total) by mouth 2 (two) times daily.     Allergies:   Patient has no known allergies.   Social History   Socioeconomic History  . Marital status: Married    Spouse name: Not on file  . Number of children: Not on file  . Years of education: Not on file  . Highest education level: Not on file  Occupational History  . Not on file  Tobacco Use  . Smoking status: Never Smoker  . Smokeless tobacco: Never Used  Vaping Use  . Vaping Use: Never used  Substance and Sexual Activity  . Alcohol use: Not Currently  . Drug use: Never  . Sexual activity: Not on file  Other Topics Concern  . Not on file  Social History Narrative   Left handed   One story home   No caffeine   Social Determinants of Health   Financial Resource Strain:   . Difficulty of Paying Living Expenses: Not on file  Food Insecurity:   . Worried About Programme researcher, broadcasting/film/video in the Last Year: Not on file  . Ran Out of Food in the Last Year: Not on file  Transportation Needs:   . Lack of Transportation (Medical): Not on file  . Lack of Transportation (Non-Medical): Not on file  Physical Activity:   . Days of Exercise per Week: Not on file  . Minutes of Exercise per Session: Not on file  Stress:   . Feeling of Stress : Not on file  Social Connections:   . Frequency of Communication with Friends and Family: Not on file  . Frequency of Social Gatherings with Friends and Family: Not on file  . Attends Religious Services: Not on file  . Active Member of Clubs or Organizations: Not on file  . Attends Banker Meetings: Not on file  . Marital Status: Not on file     Family History: The patient's family history includes Alzheimer's disease in her brother; Cancer in her mother; Cancer - Colon in her brother; Emphysema in her sister; Heart attack in her brother and father; Stroke in her sister.  ROS:   Please see the history of present illness.    All other systems reviewed and are  negative.  EKGs/Labs/Other Studies Reviewed:    The following studies were reviewed today: EKG reveals sinus rhythm nonspecific ST-T changes   Recent Labs: 07/14/2020: ALT 13; BUN 11; Creatinine, Ser 0.99; Hemoglobin 12.9; Magnesium 1.9; Platelets 264; Potassium 5.0; Sodium 137; TSH 2.085  Recent Lipid Panel No results found for: CHOL, TRIG, HDL, CHOLHDL, VLDL, LDLCALC, LDLDIRECT  Physical Exam:    VS:  BP 134/74   Pulse 84   Ht 5\' 3"  (1.6 m)   Wt 126 lb 3.2 oz (57.2 kg)   SpO2 97%   BMI 22.36 kg/m     Wt Readings from Last 3 Encounters:  09/06/20 126 lb 3.2 oz (57.2 kg)  07/21/20 125 lb (56.7 kg)  03/08/20 124 lb (56.2 kg)     GEN: Patient is in no acute distress HEENT: Normal NECK: No JVD; No carotid bruits LYMPHATICS: No lymphadenopathy CARDIAC: Hear sounds regular, 2/6 systolic murmur at the apex. RESPIRATORY:  Clear to auscultation without rales, wheezing or rhonchi  ABDOMEN: Soft, non-tender, non-distended MUSCULOSKELETAL:  No edema; No deformity  SKIN: Warm and dry NEUROLOGIC:  Alert and oriented x 3 PSYCHIATRIC:  Normal affect   Signed, Garwin Brothers, MD  09/06/2020 11:30 AM    [facility]

## 2020-09-07 LAB — HEPATIC FUNCTION PANEL
ALT: 9 IU/L (ref 0–32)
AST: 14 IU/L (ref 0–40)
Albumin: 4.2 g/dL (ref 3.5–4.6)
Alkaline Phosphatase: 126 IU/L — ABNORMAL HIGH (ref 44–121)
Bilirubin Total: 0.3 mg/dL (ref 0.0–1.2)
Bilirubin, Direct: 0.11 mg/dL (ref 0.00–0.40)
Total Protein: 6.8 g/dL (ref 6.0–8.5)

## 2020-09-07 LAB — BASIC METABOLIC PANEL
BUN/Creatinine Ratio: 17 (ref 12–28)
BUN: 17 mg/dL (ref 10–36)
CO2: 27 mmol/L (ref 20–29)
Calcium: 9.6 mg/dL (ref 8.7–10.3)
Chloride: 100 mmol/L (ref 96–106)
Creatinine, Ser: 0.99 mg/dL (ref 0.57–1.00)
GFR calc Af Amer: 58 mL/min/{1.73_m2} — ABNORMAL LOW (ref >59)
GFR calc non Af Amer: 50 mL/min/{1.73_m2} — ABNORMAL LOW (ref >59)
Glucose: 80 mg/dL (ref 65–99)
Potassium: 5.1 mmol/L (ref 3.5–5.2)
Sodium: 140 mmol/L (ref 134–144)

## 2020-09-07 LAB — LIPID PANEL
Chol/HDL Ratio: 2.6 ratio (ref 0.0–4.4)
Cholesterol, Total: 211 mg/dL — ABNORMAL HIGH (ref 100–199)
HDL: 80 mg/dL (ref >39)
LDL Chol Calc (NIH): 102 mg/dL — ABNORMAL HIGH (ref 0–99)
Triglycerides: 169 mg/dL — ABNORMAL HIGH (ref 0–149)
VLDL Cholesterol Cal: 29 mg/dL (ref 5–40)

## 2020-09-07 LAB — CBC WITH DIFFERENTIAL/PLATELET
Basophils Absolute: 0.1 10*3/uL (ref 0.0–0.2)
Basos: 1 %
EOS (ABSOLUTE): 0.1 10*3/uL (ref 0.0–0.4)
Eos: 1 %
Hematocrit: 37.7 % (ref 34.0–46.6)
Hemoglobin: 12.6 g/dL (ref 11.1–15.9)
Immature Grans (Abs): 0 10*3/uL (ref 0.0–0.1)
Immature Granulocytes: 0 %
Lymphocytes Absolute: 2.4 10*3/uL (ref 0.7–3.1)
Lymphs: 23 %
MCH: 31.6 pg (ref 26.6–33.0)
MCHC: 33.4 g/dL (ref 31.5–35.7)
MCV: 95 fL (ref 79–97)
Monocytes Absolute: 1.1 10*3/uL — ABNORMAL HIGH (ref 0.1–0.9)
Monocytes: 11 %
Neutrophils Absolute: 6.5 10*3/uL (ref 1.4–7.0)
Neutrophils: 64 %
Platelets: 270 10*3/uL (ref 150–450)
RBC: 3.99 x10E6/uL (ref 3.77–5.28)
RDW: 12.1 % (ref 11.7–15.4)
WBC: 10.2 10*3/uL (ref 3.4–10.8)

## 2020-09-07 LAB — TSH: TSH: 3.93 u[IU]/mL (ref 0.450–4.500)

## 2020-11-16 ENCOUNTER — Other Ambulatory Visit: Payer: Self-pay | Admitting: Cardiology

## 2020-12-19 DIAGNOSIS — U071 COVID-19: Secondary | ICD-10-CM

## 2020-12-19 HISTORY — DX: COVID-19: U07.1

## 2021-02-17 DIAGNOSIS — E785 Hyperlipidemia, unspecified: Secondary | ICD-10-CM | POA: Insufficient documentation

## 2021-02-17 DIAGNOSIS — E039 Hypothyroidism, unspecified: Secondary | ICD-10-CM | POA: Insufficient documentation

## 2021-02-27 ENCOUNTER — Encounter: Payer: Self-pay | Admitting: Cardiology

## 2021-02-27 ENCOUNTER — Ambulatory Visit: Payer: Medicare HMO | Admitting: Cardiology

## 2021-02-27 ENCOUNTER — Other Ambulatory Visit: Payer: Self-pay

## 2021-02-27 VITALS — BP 134/60 | HR 62 | Ht 62.0 in | Wt 128.4 lb

## 2021-02-27 DIAGNOSIS — E119 Type 2 diabetes mellitus without complications: Secondary | ICD-10-CM

## 2021-02-27 DIAGNOSIS — F419 Anxiety disorder, unspecified: Secondary | ICD-10-CM

## 2021-02-27 DIAGNOSIS — Z209 Contact with and (suspected) exposure to unspecified communicable disease: Secondary | ICD-10-CM

## 2021-02-27 DIAGNOSIS — M810 Age-related osteoporosis without current pathological fracture: Secondary | ICD-10-CM | POA: Insufficient documentation

## 2021-02-27 DIAGNOSIS — I10 Essential (primary) hypertension: Secondary | ICD-10-CM | POA: Diagnosis not present

## 2021-02-27 DIAGNOSIS — F0280 Dementia in other diseases classified elsewhere without behavioral disturbance: Secondary | ICD-10-CM | POA: Insufficient documentation

## 2021-02-27 DIAGNOSIS — D518 Other vitamin B12 deficiency anemias: Secondary | ICD-10-CM | POA: Insufficient documentation

## 2021-02-27 DIAGNOSIS — I2510 Atherosclerotic heart disease of native coronary artery without angina pectoris: Secondary | ICD-10-CM

## 2021-02-27 DIAGNOSIS — G309 Alzheimer's disease, unspecified: Secondary | ICD-10-CM

## 2021-02-27 DIAGNOSIS — E782 Mixed hyperlipidemia: Secondary | ICD-10-CM | POA: Insufficient documentation

## 2021-02-27 DIAGNOSIS — R9439 Abnormal result of other cardiovascular function study: Secondary | ICD-10-CM | POA: Diagnosis not present

## 2021-02-27 DIAGNOSIS — J449 Chronic obstructive pulmonary disease, unspecified: Secondary | ICD-10-CM | POA: Insufficient documentation

## 2021-02-27 HISTORY — DX: Other vitamin B12 deficiency anemias: D51.8

## 2021-02-27 HISTORY — DX: Dementia in other diseases classified elsewhere, unspecified severity, without behavioral disturbance, psychotic disturbance, mood disturbance, and anxiety: F02.80

## 2021-02-27 HISTORY — DX: Atherosclerotic heart disease of native coronary artery without angina pectoris: I25.10

## 2021-02-27 HISTORY — DX: Anxiety disorder, unspecified: F41.9

## 2021-02-27 HISTORY — DX: Alzheimer's disease, unspecified: G30.9

## 2021-02-27 HISTORY — DX: Age-related osteoporosis without current pathological fracture: M81.0

## 2021-02-27 HISTORY — DX: Type 2 diabetes mellitus without complications: E11.9

## 2021-02-27 HISTORY — DX: Chronic obstructive pulmonary disease, unspecified: J44.9

## 2021-02-27 HISTORY — DX: Contact with and (suspected) exposure to unspecified communicable disease: Z20.9

## 2021-02-27 HISTORY — DX: Mixed hyperlipidemia: E78.2

## 2021-02-27 NOTE — Patient Instructions (Signed)

## 2021-02-27 NOTE — Progress Notes (Signed)
Cardiology Office Note:    Date:  02/27/2021   ID:  Megan Everett, DOB [birth year], MRN [id number]  PCP:  Olive Bass, MD  Cardiologist:  Garwin Brothers, MD   Referring MD: Olive Bass, MD    ASSESSMENT:    1. Essential hypertension   2. Hyperlipidemia, mixed   3. Atherosclerosis of native coronary artery of native heart without angina pectoris   4. Abnormal nuclear stress test    PLAN:    In order of problems listed above:  1. Primary prevention stressed with the patient.  Importance of compliance with diet medication stressed and she vocalized understanding. 2. Abnormal stress test: Stable at this time.  She is well ambulatory without any symptoms and is happy about it.  Continue medical management.  She is taking ranolazine. 3. Mixed dyslipidemia: Diet was emphasized.  She is on statin therapy.  Lipids reviewed.  This is followed by primary care. 4. I told her to keep active and ambulatory age appropriately.  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 5. Patient will be seen in follow-up appointment in 6 months or earlier if the patient has any concerns    Medication Adjustments/Labs and Tests Ordered: Current medicines are reviewed at length with the patient today.  Concerns regarding medicines are outlined above.  No orders of the defined types were placed in this encounter.  No orders of the defined types were placed in this encounter.    No chief complaint on file.    History of Present Illness:    Megan Everett is a 86 y.o. female.  Patient has past medical history of mixed dyslipidemia.  She has had abnormal stress test in the past.  Currently she is doing well she denies any chest pain.  She lives in assisted living and she is happy over there.  She tells me that she helps with cleaning the kitchen and even mopping the floor without any symptoms.  She is happy that she is able to do it and feels content because of her quality of life.  At the time of  my evaluation, the patient is alert awake oriented and in no distress.  Past Medical History:  Diagnosis Date  . Abnormal nuclear stress test 06/03/2019  . Acquired hypothyroidism 06/05/2016  . Alzheimer's dementia (HCC)   . Asthma   . Chest pain   . COVID-19 virus infection 12/19/2020   Formatting of this note might be different from the original. 09/2020  . Dementia (HCC) 03/16/2016   Formatting of this note might be different from the original. (2014) MMSE 26 (2015) MMSE 22 2019: MMSE 22  . Essential hypertension 06/03/2019  . Family history of premature coronary artery disease 10/13/2018   Formatting of this note might be different from the original. brother  . GERD (gastroesophageal reflux disease) 03/16/2016  . Hyperlipidemia   . Hyperlipidemia, mixed 03/16/2016  . Hypothyroidism   . LPRD (laryngopharyngeal reflux disease) 03/16/2016  . Mixed dyslipidemia 06/03/2019  . Osteoporosis 03/16/2016  . Screening for diabetes mellitus (DM) 04/07/2018   Formatting of this note might be different from the original. 2019: prediabetes resolved  . Vitamin D deficiency 08/13/2016    Past Surgical History:  Procedure Laterality Date  . ABDOMINAL HYSTERECTOMY    . TONSILLECTOMY      Current Medications: Current Meds  Medication Sig  . albuterol (VENTOLIN HFA) 108 (90 Base) MCG/ACT inhaler Inhale 1-2 puffs into the lungs every 4 (four) hours  as needed for wheezing or shortness of breath.   Marland Kitchen atorvastatin (LIPITOR) 40 MG tablet Take 1 tablet (40 mg total) by mouth daily.  . calcium carbonate (OS-CAL - DOSED IN MG OF ELEMENTAL CALCIUM) 1250 (500 Ca) MG tablet Take 1 tablet by mouth 2 (two) times daily with a meal.  . donepezil (ARICEPT) 10 MG tablet Take 10 mg by mouth at bedtime.  Marland Kitchen escitalopram (LEXAPRO) 5 MG tablet Take 1 tablet (5 mg total) by mouth daily.  Marland Kitchen levothyroxine (SYNTHROID) 50 MCG tablet Take 50 mcg by mouth daily.  . montelukast (SINGULAIR) 10 MG tablet Take 10 mg by mouth at bedtime.   . pantoprazole (PROTONIX) 40 MG tablet Take 1 tablet by mouth daily.  . ranolazine (RANEXA) 500 MG 12 hr tablet Take 1 tablet (500 mg total) by mouth 2 (two) times daily.  . Vitamin D, Ergocalciferol, (DRISDOL) 1.25 MG (50000 UNIT) CAPS capsule Take 1 capsule by mouth once a week.     Allergies:   Patient has no known allergies.   Social History   Socioeconomic History  . Marital status: Married    Spouse name: Not on file  . Number of children: Not on file  . Years of education: Not on file  . Highest education level: Not on file  Occupational History  . Not on file  Tobacco Use  . Smoking status: Never Smoker  . Smokeless tobacco: Never Used  Vaping Use  . Vaping Use: Never used  Substance and Sexual Activity  . Alcohol use: Not Currently  . Drug use: Never  . Sexual activity: Not on file  Other Topics Concern  . Not on file  Social History Narrative   Left handed   One story home   No caffeine   Social Determinants of Health   Financial Resource Strain: Not on file  Food Insecurity: Not on file  Transportation Needs: Not on file  Physical Activity: Not on file  Stress: Not on file  Social Connections: Not on file     Family History: The patient's family history includes Alzheimer's disease in her brother; Cancer in her mother; Cancer - Colon in her brother; Emphysema in her sister; Heart attack in her brother and father; Stroke in her sister.  ROS:   Please see the history of present illness.    All other systems reviewed and are negative.  EKGs/Labs/Other Studies Reviewed:    The following studies were reviewed today: I discussed my findings with the patient at length   Recent Labs: 07/14/2020: Magnesium 1.9 09/06/2020: ALT 9; BUN 17; Creatinine, Ser 0.99; Hemoglobin 12.6; Platelets 270; Potassium 5.1; Sodium 140; TSH 3.930  Recent Lipid Panel    Component Value Date/Time   CHOL 211 (H) 09/06/2020 1144   TRIG 169 (H) 09/06/2020 1144   HDL 80  09/06/2020 1144   CHOLHDL 2.6 09/06/2020 1144   LDLCALC 102 (H) 09/06/2020 1144    Physical Exam:    VS:  BP 134/60   Pulse 62   Ht 5\' 2"  (1.575 m)   Wt 128 lb 6.4 oz (58.2 kg)   SpO2 92%   BMI 23.48 kg/m     Wt Readings from Last 3 Encounters:  02/27/21 128 lb 6.4 oz (58.2 kg)  09/06/20 126 lb 3.2 oz (57.2 kg)  07/21/20 125 lb (56.7 kg)     GEN: Patient is in no acute distress HEENT: Normal NECK: No JVD; No carotid bruits LYMPHATICS: No lymphadenopathy CARDIAC: Hear sounds regular, 2/6  systolic murmur at the apex. RESPIRATORY:  Clear to auscultation without rales, wheezing or rhonchi  ABDOMEN: Soft, non-tender, non-distended MUSCULOSKELETAL:  No edema; No deformity  SKIN: Warm and dry NEUROLOGIC:  Alert and oriented x 3 PSYCHIATRIC:  Normal affect   Signed, Garwin Brothers, MD  02/27/2021 3:05 PM    [facility]

## 2021-09-19 ENCOUNTER — Ambulatory Visit: Payer: Medicare HMO | Admitting: Cardiology

## 2021-10-24 IMAGING — CT CT HEAD W/O CM
4 series · 16 of 47 positions shown, 18 images · non-contrast
Comparison: 11/12/2012

CLINICAL DATA: Headache.

EXAM:
CT HEAD WITHOUT CONTRAST
TECHNIQUE: Contiguous axial images were obtained from the base of the skull
through the vertex without intravenous contrast.

[Series 3: head without · axial · non-contrast · 0.45mm/px · z∈[-105,+15]mm · 7 of 33 slices shown, 9 images]
[im 5/33  brain]
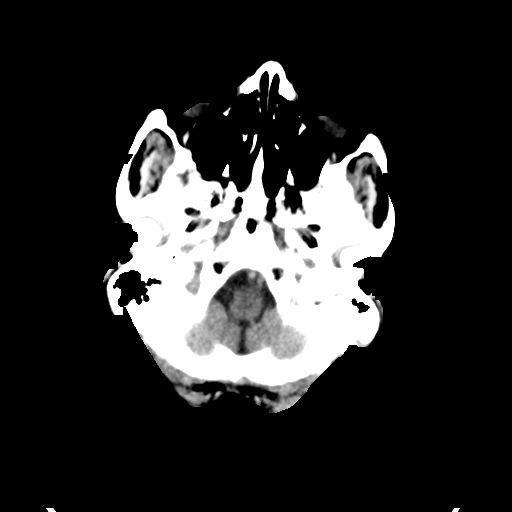
[im 5/33  bone]
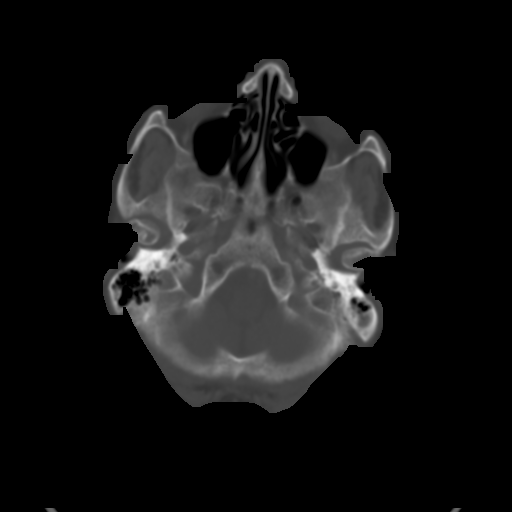
[im 9/33  brain]
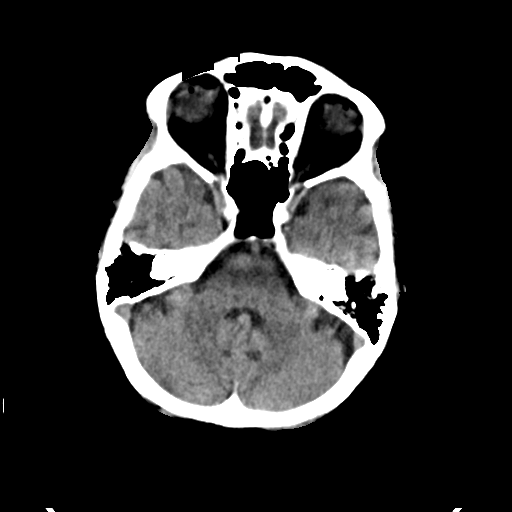
[im 13/33  brain]
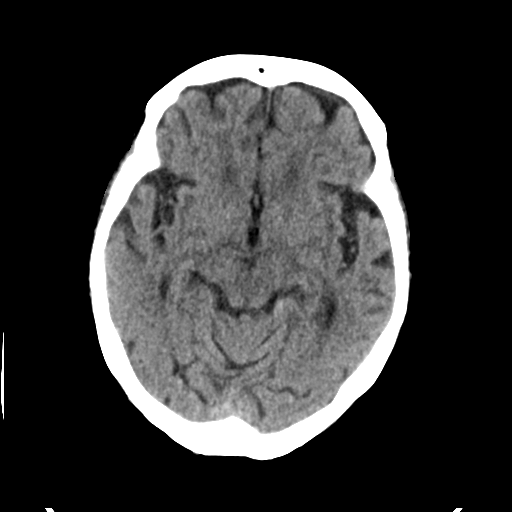
[im 17/33  brain]
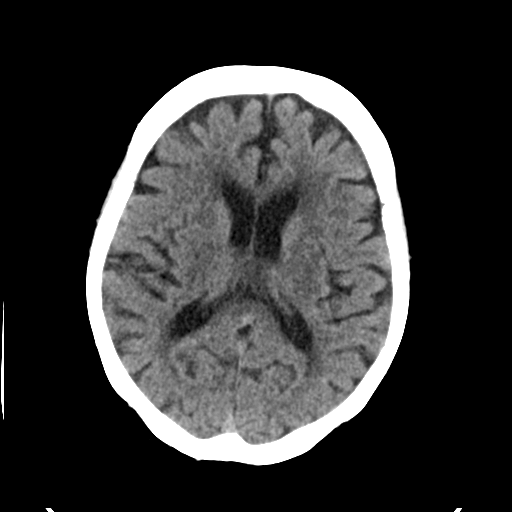
[im 21/33  brain]
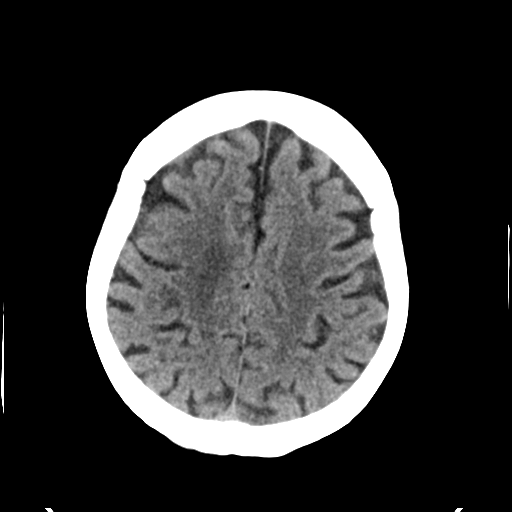
[im 21/33  bone]
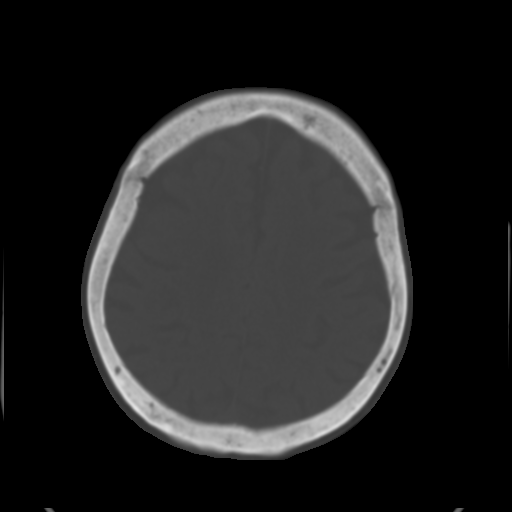
[im 25/33  brain]
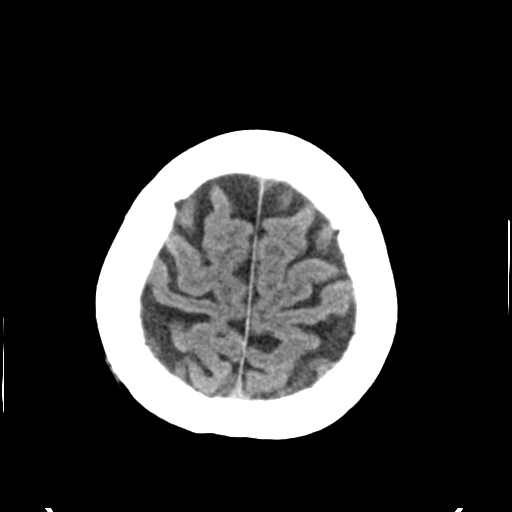
[im 29/33  brain]
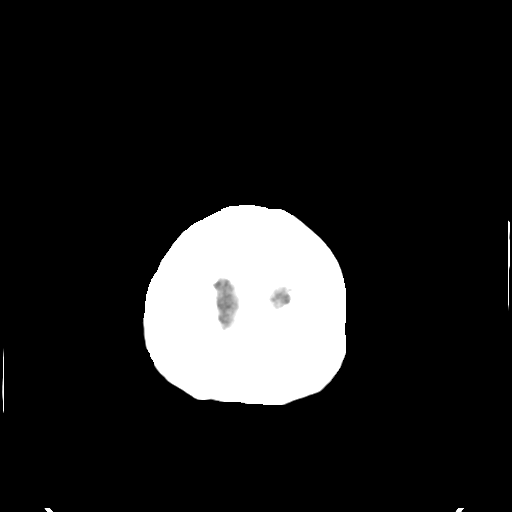

[Series 4: head bone · axial · 0.45mm/px · z∈[-109,-77]mm · 3 of 81 slices shown]
[im 9/81  bone]
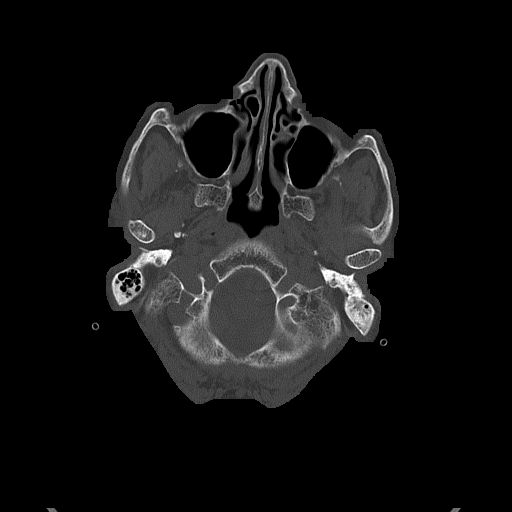
[im 17/81  bone]
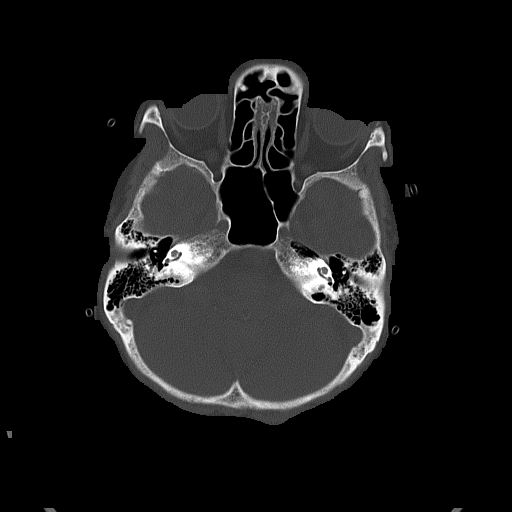
[im 25/81  bone]
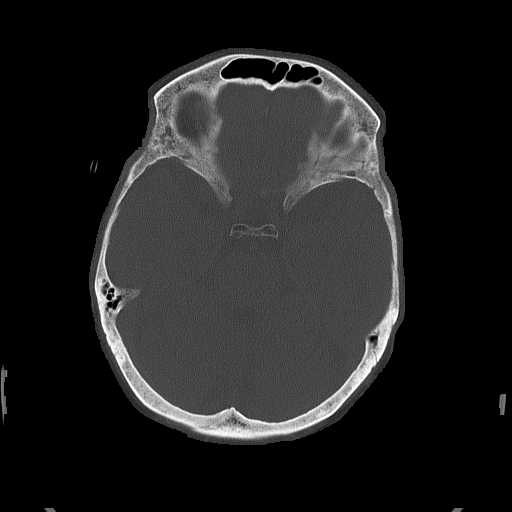

[Series 5: head without cor · coronal · non-contrast · 0.34mm/px · 3 of 67 slices shown]
[im 23/67  brain]
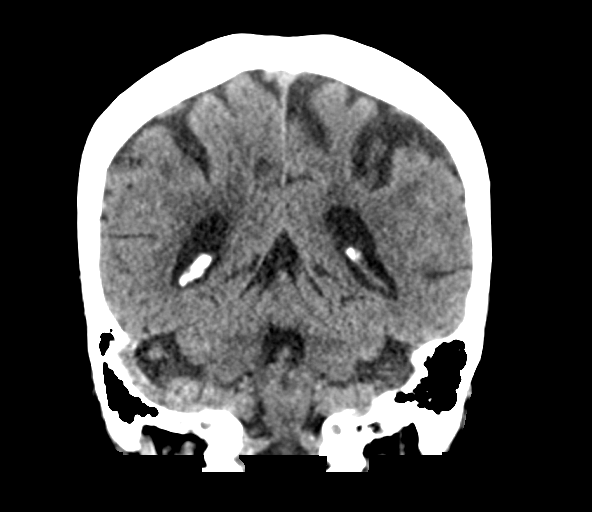
[im 30/67  brain]
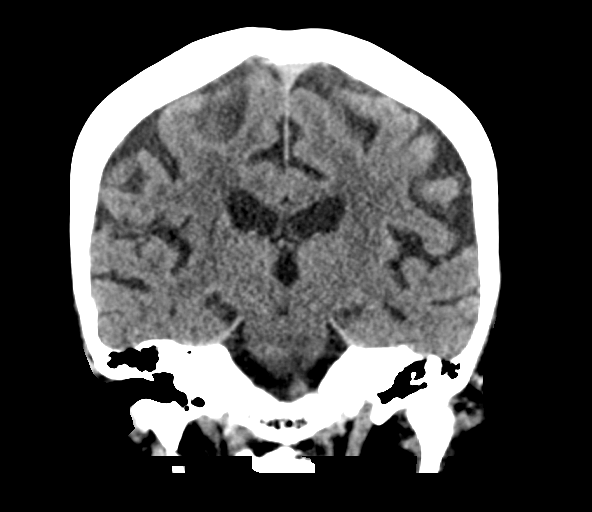
[im 37/67  brain]
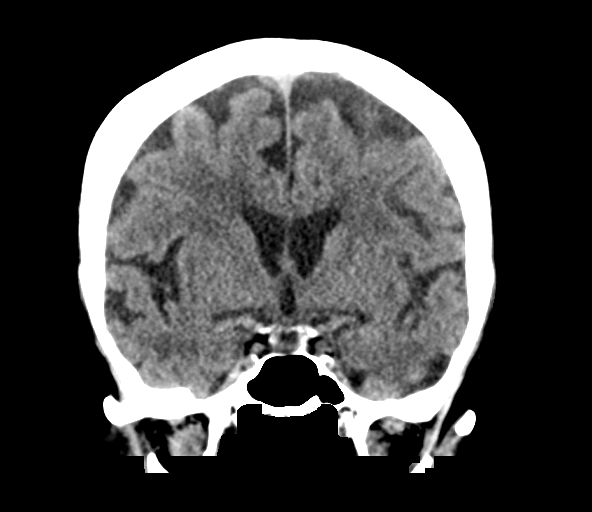

[Series 6: head without sag · sagittal · non-contrast · 0.34mm/px · 3 of 64 slices shown]
[im 22/64  brain]
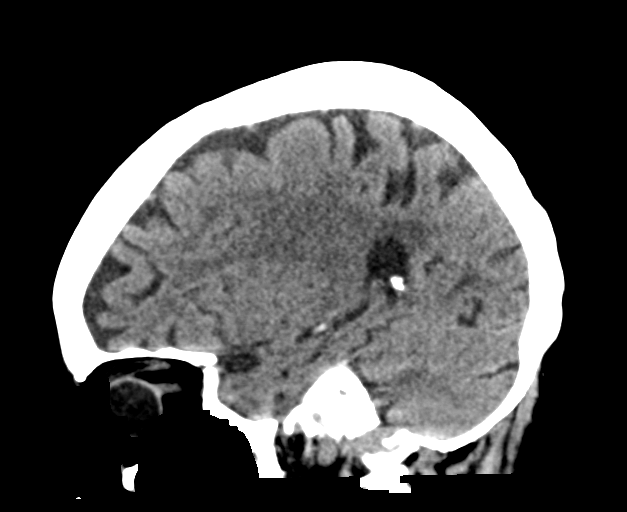
[im 32/64  brain]
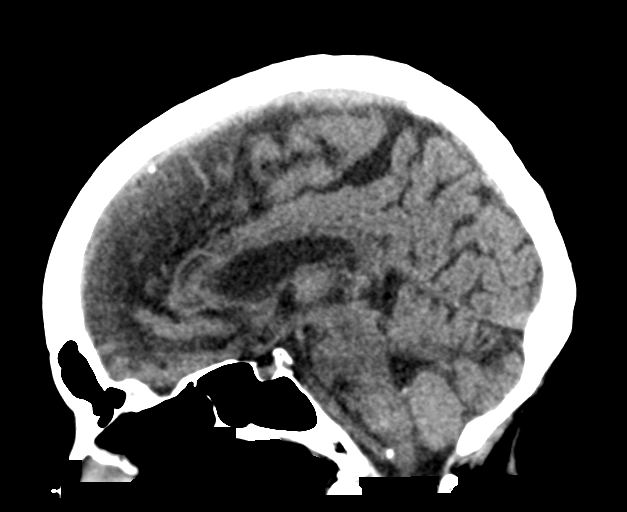
[im 43/64  brain]
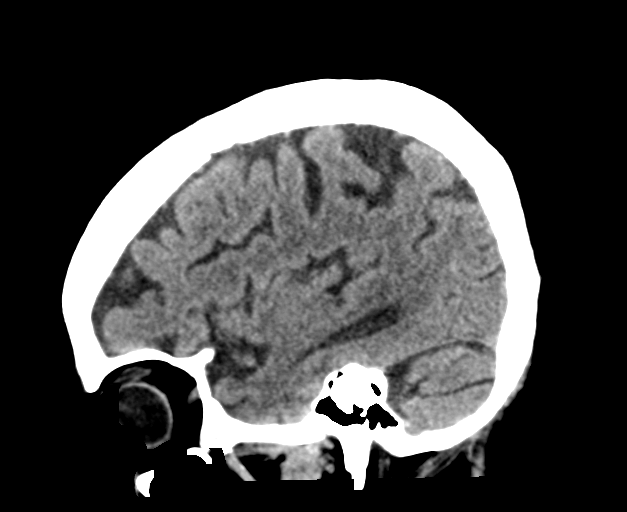

[16 of 47 positions shown; findings below may reference images not displayed]

FINDINGS: Brain: Mild cerebral atrophy is appropriate for age. Low-density
throughout the periventricular and subcortical white matter is
suggestive for chronic changes. No evidence for acute hemorrhage,
mass lesion, midline shift, hydrocephalus or large infarct.

Vascular: No hyperdense vessel or unexpected calcification.

Skull: Normal. Negative for fracture or focal lesion.

Sinuses/Orbits: Visualized paranasal sinuses are clear. Mastoid air
cells are clear.

Other: None
IMPRESSION: 1. No acute intracranial abnormality.
2. Low-density in the white matter is suggestive for chronic small
vessel ischemic changes.

## 2022-01-31 ENCOUNTER — Telehealth: Payer: Self-pay | Admitting: Cardiology

## 2022-01-31 NOTE — Telephone Encounter (Signed)
Appointment has been made for 4/21.  ?

## 2022-01-31 NOTE — Telephone Encounter (Signed)
Pt's daughter stated that pt recently was discharged from the hospital and wanted f/u appt. Pt was scheduled for soonest appt, which is 6/13. Daughter would like a callback to see if pt can come in sooner. Please advise ?

## 2022-02-01 ENCOUNTER — Other Ambulatory Visit: Payer: Self-pay

## 2022-02-01 DIAGNOSIS — L209 Atopic dermatitis, unspecified: Secondary | ICD-10-CM

## 2022-02-01 DIAGNOSIS — N189 Chronic kidney disease, unspecified: Secondary | ICD-10-CM

## 2022-02-01 DIAGNOSIS — F515 Nightmare disorder: Secondary | ICD-10-CM

## 2022-02-01 DIAGNOSIS — I209 Angina pectoris, unspecified: Secondary | ICD-10-CM | POA: Insufficient documentation

## 2022-02-01 HISTORY — DX: Nightmare disorder: F51.5

## 2022-02-01 HISTORY — DX: Chronic kidney disease, unspecified: N18.9

## 2022-02-01 HISTORY — DX: Angina pectoris, unspecified: I20.9

## 2022-02-01 HISTORY — DX: Atopic dermatitis, unspecified: L20.9

## 2022-02-02 ENCOUNTER — Ambulatory Visit: Payer: Medicare HMO | Admitting: Cardiology

## 2022-02-02 ENCOUNTER — Encounter: Payer: Self-pay | Admitting: Cardiology

## 2022-02-02 VITALS — BP 127/68 | HR 74 | Ht 62.0 in | Wt 128.4 lb

## 2022-02-02 DIAGNOSIS — I2510 Atherosclerotic heart disease of native coronary artery without angina pectoris: Secondary | ICD-10-CM | POA: Diagnosis not present

## 2022-02-02 DIAGNOSIS — E782 Mixed hyperlipidemia: Secondary | ICD-10-CM | POA: Diagnosis not present

## 2022-02-02 DIAGNOSIS — I10 Essential (primary) hypertension: Secondary | ICD-10-CM

## 2022-02-02 NOTE — Progress Notes (Signed)
?Cardiology Office Note:   ? ?Date:  02/02/2022  ? ?ID:  Megan Everett, DOB 09-Oct-1928, MRN [id number] ? ?PCP:  Ellan Lambert, NP  ?Cardiologist:  Garwin Brothers, MD  ? ?Referring MD: Olive Bass, MD  ? ? ?ASSESSMENT:   ? ?1. Atherosclerosis of native coronary artery of native heart without angina pectoris   ?2. Essential hypertension   ?3. Hyperlipidemia, mixed   ?4. Mixed dyslipidemia   ? ?PLAN:   ? ?In order of problems listed above: ? ?Coronary artery disease: Secondary prevention stressed with the patient.  Importance of compliance with diet medication stressed and she vocalized understanding. ?Essential hypertension: Blood pressure stable and diet was emphasized.  Postural blood pressures were checked today and they were fine.  She is asymptomatic when she sits up and stands up. ?Mixed dyslipidemia: On statin therapy and followed by primary care.  Diet emphasized. ?Patient will be seen in follow-up appointment in 6 months or earlier if the patient has any concerns ? ? ? ?Medication Adjustments/Labs and Tests Ordered: ?Current medicines are reviewed at length with the patient today.  Concerns regarding medicines are outlined above.  ?No orders of the defined types were placed in this encounter. ? ?No orders of the defined types were placed in this encounter. ? ? ? ?No chief complaint on file. ?  ? ?History of Present Illness:   ? ?Megan Everett is a 88 y.o. female.  Patient has past medical history of atherosclerotic coronary artery disease, essential hypertension and dyslipidemia.  She denies any problems at this time and takes care of activities of daily living.  Her family member has brought her for this visit and is very supportive.  At the time of my evaluation, the patient is alert awake oriented and in no distress.  She recently had a fall and was evaluated in the emergency room and was unremarkable.  CT of the head was done according to family member and was unremarkable. ? ?Past Medical  History:  ?Diagnosis Date  ? Abnormal nuclear stress test 06/03/2019  ? Acquired hypothyroidism 06/05/2016  ? Age-related osteoporosis without current pathological fracture 02/27/2021  ? Alzheimer's dementia (HCC)   ? Alzheimer's disease (HCC) 02/27/2021  ? Angina pectoris (HCC) 02/01/2022  ? Anxiety disorder 02/27/2021  ? Asthma without status asthmaticus 03/16/2016  ? Atherosclerotic heart disease of native coronary artery without angina pectoris 02/27/2021  ? Atopic dermatitis 02/01/2022  ? Chest pain   ? Chronic kidney disease 02/01/2022  ? Chronic obstructive pulmonary disease (HCC) 02/27/2021  ? COVID-19 12/19/2020  ? Formatting of this note might be different from the original. 09/2020  ? Dementia (HCC) 03/16/2016  ? Formatting of this note might be different from the original. (2014) MMSE 26 (2015) MMSE 22 2019: MMSE 22  ? Essential hypertension 06/03/2019  ? Exposure to communicable disease 02/27/2021  ? Family history of premature coronary artery disease 10/13/2018  ? Formatting of this note might be different from the original. brother  ? Gastro-esophageal reflux disease without esophagitis 03/16/2016  ? Hyperlipidemia   ? Hyperlipidemia, mixed 03/16/2016  ? Hypothyroidism   ? LPRD (laryngopharyngeal reflux disease) 03/16/2016  ? Mixed dyslipidemia 06/03/2019  ? Mixed hyperlipidemia 02/27/2021  ? 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 Osteoporosis 03/16/2016  ? Other vitamin B12 deficiency anemias 02/27/2021  ? Screening for diabetes mellitus (DM) 04/07/2018  ? Formatting of this note might be different from the original. 2019: prediabetes resolved  ? Type 2 diabetes mellitus without complications (  HCC) 02/27/2021  ? Vitamin D deficiency 08/13/2016  ? ? ?Past Surgical History:  ?Procedure Laterality Date  ? ABDOMINAL HYSTERECTOMY    ? TONSILLECTOMY    ? ? ?Current Medications: ?Current Meds  ?Medication Sig  ? albuterol (VENTOLIN HFA) 108 (90 Base) MCG/ACT inhaler Inhale 1-2 puffs into the lungs every 4 (four) hours as needed for  wheezing or shortness of breath.   ? amLODipine (NORVASC) 2.5 MG tablet Take 2.5 mg by mouth daily.  ? atorvastatin (LIPITOR) 40 MG tablet Take 1 tablet (40 mg total) by mouth daily.  ? calcium carbonate (OS-CAL - DOSED IN MG OF ELEMENTAL CALCIUM) 1250 (500 Ca) MG tablet Take 1 tablet by mouth 2 (two) times daily with a meal.  ? ciprofloxacin (CIPRO) 500 MG tablet Take 500 mg by mouth 2 (two) times daily.  ? donepezil (ARICEPT) 10 MG tablet Take 10 mg by mouth at bedtime.  ? escitalopram (LEXAPRO) 10 MG tablet Take 10 mg by mouth daily.  ? FLOVENT HFA 110 MCG/ACT inhaler Inhale 2 puffs into the lungs 2 (two) times daily.  ? levothyroxine (SYNTHROID) 50 MCG tablet Take 50 mcg by mouth daily.  ? montelukast (SINGULAIR) 10 MG tablet Take 10 mg by mouth at bedtime.  ? pantoprazole (PROTONIX) 40 MG tablet Take 1 tablet by mouth daily.  ? ranolazine (RANEXA) 500 MG 12 hr tablet Take 1 tablet (500 mg total) by mouth 2 (two) times daily.  ? Vitamin D, Ergocalciferol, (DRISDOL) 1.25 MG (50000 UNIT) CAPS capsule Take 1 capsule by mouth once a week.  ?  ? ?Allergies:   Patient has no known allergies.  ? ?Social History  ? ?Socioeconomic History  ? Marital status: Married  ?  Spouse name: Not on file  ? Number of children: Not on file  ? Years of education: Not on file  ? Highest education level: Not on file  ?Occupational History  ? Not on file  ?Tobacco Use  ? Smoking status: Never  ? Smokeless tobacco: Never  ?Vaping Use  ? Vaping Use: Never used  ?Substance and Sexual Activity  ? Alcohol use: Not Currently  ? Drug use: Never  ? Sexual activity: Not on file  ?Other Topics Concern  ? Not on file  ?Social History Narrative  ? Left handed  ? One story home  ? No caffeine  ? ?Social Determinants of Health  ? ?Financial Resource Strain: Not on file  ?Food Insecurity: Not on file  ?Transportation Needs: Not on file  ?Physical Activity: Not on file  ?Stress: Not on file  ?Social Connections: Not on file  ?  ? ?Family History: ?The  patient's family history includes Alzheimer's disease in her brother; Cancer in her mother; Cancer - Colon in her brother; Emphysema in her sister; Heart attack in her brother and father; Stroke in her sister. ? ?ROS:   ?Please see the history of present illness.    ?All other systems reviewed and are negative. ? ?EKGs/Labs/Other Studies Reviewed:   ? ?The following studies were reviewed today: ?I reviewed records from emergency room.  Including EKG and lab work. ? ? ?Recent Labs: ?No results found for requested labs within last 8760 hours.  ?Recent Lipid Panel ?   ?Component Value Date/Time  ? CHOL 211 (H) 09/06/2020 1144  ? TRIG 169 (H) 09/06/2020 1144  ? HDL 80 09/06/2020 1144  ? CHOLHDL 2.6 09/06/2020 1144  ? LDLCALC 102 (H) 09/06/2020 1144  ? ? ?Physical Exam:   ? ?  VS:  BP 127/68 (BP Location: Left Arm, Patient Position: Sitting, Cuff Size: Normal)   Pulse 74   Ht 5\' 2"  (1.575 m)   Wt 128 lb 6.4 oz (58.2 kg)   SpO2 93%   BMI 23.48 kg/m?    ? ?Wt Readings from Last 3 Encounters:  ?02/02/22 128 lb 6.4 oz (58.2 kg)  ?02/27/21 128 lb 6.4 oz (58.2 kg)  ?09/06/20 126 lb 3.2 oz (57.2 kg)  ?  ? ?GEN: Patient is in no acute distress ?HEENT: Normal ?NECK: No JVD; No carotid bruits ?LYMPHATICS: No lymphadenopathy ?CARDIAC: Hear sounds regular, 2/6 systolic murmur at the apex. ?RESPIRATORY:  Clear to auscultation without rales, wheezing or rhonchi  ?ABDOMEN: Soft, non-tender, non-distended ?MUSCULOSKELETAL:  No edema; No deformity  ?SKIN: Warm and dry ?NEUROLOGIC:  Alert and oriented x 3 ?PSYCHIATRIC:  Normal affect  ? ?Signed, ?09/08/20, MD  ?02/02/2022 3:52 PM    ?[facility]  ?

## 2022-02-02 NOTE — Patient Instructions (Signed)
Medication Instructions:  Your physician recommends that you continue on your current medications as directed. Please refer to the Current Medication list given to you today.  *If you need a refill on your cardiac medications before your next appointment, please call your pharmacy*   Lab Work: NONE If you have labs (blood work) drawn today and your tests are completely normal, you will receive your results only by: MyChart Message (if you have MyChart) OR A paper copy in the mail If you have any lab test that is abnormal or we need to change your treatment, we will call you to review the results.   Testing/Procedures: NONE   Follow-Up: At [facility], you and your health needs are our priority.  As part of our continuing mission to provide you with exceptional heart care, we have created designated Provider Care Teams.  These Care Teams include your primary Cardiologist (physician) and Advanced Practice Providers (APPs -  Physician Assistants and Nurse Practitioners) who all work together to provide you with the care you need, when you need it.  We recommend signing up for the patient portal called "MyChart".  Sign up information is provided on this After Visit Summary.  MyChart is used to connect with patients for Virtual Visits (Telemedicine).  Patients are able to view lab/test results, encounter notes, upcoming appointments, etc.  Non-urgent messages can be sent to your provider as well.   To learn more about what you can do with MyChart, go to [email or web address].    Your next appointment:   1 year(s)  The format for your next appointment:   In Person  Provider:   Rajan Revankar, MD    Other Instructions   Important Information About Sugar       

## 2022-03-27 ENCOUNTER — Ambulatory Visit: Payer: Medicare HMO | Admitting: Cardiology

## 2022-09-16 DIAGNOSIS — F03911 Unspecified dementia, unspecified severity, with agitation: Secondary | ICD-10-CM | POA: Diagnosis not present

## 2022-09-16 DIAGNOSIS — R918 Other nonspecific abnormal finding of lung field: Secondary | ICD-10-CM | POA: Diagnosis not present

## 2022-09-16 DIAGNOSIS — J479 Bronchiectasis, uncomplicated: Secondary | ICD-10-CM | POA: Diagnosis not present

## 2022-09-16 DIAGNOSIS — F0390 Unspecified dementia without behavioral disturbance: Secondary | ICD-10-CM | POA: Diagnosis not present

## 2022-09-16 DIAGNOSIS — R059 Cough, unspecified: Secondary | ICD-10-CM | POA: Diagnosis not present

## 2022-09-16 DIAGNOSIS — J8410 Pulmonary fibrosis, unspecified: Secondary | ICD-10-CM | POA: Diagnosis not present

## 2022-09-16 DIAGNOSIS — R451 Restlessness and agitation: Secondary | ICD-10-CM | POA: Diagnosis not present

## 2022-09-16 DIAGNOSIS — K219 Gastro-esophageal reflux disease without esophagitis: Secondary | ICD-10-CM | POA: Diagnosis not present

## 2022-09-16 DIAGNOSIS — I700 Atherosclerosis of aorta: Secondary | ICD-10-CM | POA: Diagnosis not present

## 2022-09-18 DIAGNOSIS — D649 Anemia, unspecified: Secondary | ICD-10-CM | POA: Diagnosis not present

## 2022-09-18 DIAGNOSIS — R059 Cough, unspecified: Secondary | ICD-10-CM | POA: Diagnosis not present

## 2022-09-18 DIAGNOSIS — K219 Gastro-esophageal reflux disease without esophagitis: Secondary | ICD-10-CM | POA: Diagnosis not present

## 2022-09-18 DIAGNOSIS — E782 Mixed hyperlipidemia: Secondary | ICD-10-CM | POA: Diagnosis not present

## 2022-09-18 DIAGNOSIS — I2510 Atherosclerotic heart disease of native coronary artery without angina pectoris: Secondary | ICD-10-CM | POA: Diagnosis not present

## 2022-09-18 DIAGNOSIS — J45909 Unspecified asthma, uncomplicated: Secondary | ICD-10-CM | POA: Diagnosis not present

## 2022-09-18 DIAGNOSIS — R600 Localized edema: Secondary | ICD-10-CM | POA: Diagnosis not present

## 2022-09-18 DIAGNOSIS — E559 Vitamin D deficiency, unspecified: Secondary | ICD-10-CM | POA: Diagnosis not present

## 2022-09-18 DIAGNOSIS — E039 Hypothyroidism, unspecified: Secondary | ICD-10-CM | POA: Diagnosis not present

## 2022-09-19 DIAGNOSIS — Z20828 Contact with and (suspected) exposure to other viral communicable diseases: Secondary | ICD-10-CM | POA: Diagnosis not present

## 2022-09-19 DIAGNOSIS — U071 COVID-19: Secondary | ICD-10-CM | POA: Diagnosis not present

## 2022-09-20 DIAGNOSIS — R059 Cough, unspecified: Secondary | ICD-10-CM | POA: Diagnosis not present

## 2022-10-04 DIAGNOSIS — D518 Other vitamin B12 deficiency anemias: Secondary | ICD-10-CM | POA: Diagnosis not present

## 2022-10-04 DIAGNOSIS — G309 Alzheimer's disease, unspecified: Secondary | ICD-10-CM | POA: Diagnosis not present

## 2022-10-04 DIAGNOSIS — J449 Chronic obstructive pulmonary disease, unspecified: Secondary | ICD-10-CM | POA: Diagnosis not present

## 2022-10-04 DIAGNOSIS — E559 Vitamin D deficiency, unspecified: Secondary | ICD-10-CM | POA: Diagnosis not present

## 2022-10-04 DIAGNOSIS — E782 Mixed hyperlipidemia: Secondary | ICD-10-CM | POA: Diagnosis not present

## 2022-10-04 DIAGNOSIS — I10 Essential (primary) hypertension: Secondary | ICD-10-CM | POA: Diagnosis not present

## 2022-10-04 DIAGNOSIS — E119 Type 2 diabetes mellitus without complications: Secondary | ICD-10-CM | POA: Diagnosis not present

## 2022-10-04 DIAGNOSIS — J45909 Unspecified asthma, uncomplicated: Secondary | ICD-10-CM | POA: Diagnosis not present

## 2022-10-04 DIAGNOSIS — M810 Age-related osteoporosis without current pathological fracture: Secondary | ICD-10-CM | POA: Diagnosis not present

## 2022-10-19 DIAGNOSIS — I10 Essential (primary) hypertension: Secondary | ICD-10-CM | POA: Diagnosis not present

## 2022-10-19 DIAGNOSIS — G309 Alzheimer's disease, unspecified: Secondary | ICD-10-CM | POA: Diagnosis not present

## 2022-10-19 DIAGNOSIS — J449 Chronic obstructive pulmonary disease, unspecified: Secondary | ICD-10-CM | POA: Diagnosis not present

## 2022-10-19 DIAGNOSIS — M810 Age-related osteoporosis without current pathological fracture: Secondary | ICD-10-CM | POA: Diagnosis not present

## 2022-10-19 DIAGNOSIS — K219 Gastro-esophageal reflux disease without esophagitis: Secondary | ICD-10-CM | POA: Diagnosis not present

## 2022-10-19 DIAGNOSIS — E782 Mixed hyperlipidemia: Secondary | ICD-10-CM | POA: Diagnosis not present

## 2022-10-19 DIAGNOSIS — E559 Vitamin D deficiency, unspecified: Secondary | ICD-10-CM | POA: Diagnosis not present

## 2022-10-19 DIAGNOSIS — R600 Localized edema: Secondary | ICD-10-CM | POA: Diagnosis not present

## 2022-10-19 DIAGNOSIS — E039 Hypothyroidism, unspecified: Secondary | ICD-10-CM | POA: Diagnosis not present

## 2022-11-06 DIAGNOSIS — D518 Other vitamin B12 deficiency anemias: Secondary | ICD-10-CM | POA: Diagnosis not present

## 2022-11-06 DIAGNOSIS — E119 Type 2 diabetes mellitus without complications: Secondary | ICD-10-CM | POA: Diagnosis not present

## 2022-11-06 DIAGNOSIS — E559 Vitamin D deficiency, unspecified: Secondary | ICD-10-CM | POA: Diagnosis not present

## 2022-11-06 DIAGNOSIS — M810 Age-related osteoporosis without current pathological fracture: Secondary | ICD-10-CM | POA: Diagnosis not present

## 2022-11-06 DIAGNOSIS — J449 Chronic obstructive pulmonary disease, unspecified: Secondary | ICD-10-CM | POA: Diagnosis not present

## 2022-11-06 DIAGNOSIS — E038 Other specified hypothyroidism: Secondary | ICD-10-CM | POA: Diagnosis not present

## 2022-11-06 DIAGNOSIS — I10 Essential (primary) hypertension: Secondary | ICD-10-CM | POA: Diagnosis not present

## 2022-11-06 DIAGNOSIS — E782 Mixed hyperlipidemia: Secondary | ICD-10-CM | POA: Diagnosis not present

## 2022-11-06 DIAGNOSIS — G309 Alzheimer's disease, unspecified: Secondary | ICD-10-CM | POA: Diagnosis not present

## 2022-11-16 DIAGNOSIS — I10 Essential (primary) hypertension: Secondary | ICD-10-CM | POA: Diagnosis not present

## 2022-11-16 DIAGNOSIS — E782 Mixed hyperlipidemia: Secondary | ICD-10-CM | POA: Diagnosis not present

## 2022-11-16 DIAGNOSIS — G309 Alzheimer's disease, unspecified: Secondary | ICD-10-CM | POA: Diagnosis not present

## 2022-11-16 DIAGNOSIS — L853 Xerosis cutis: Secondary | ICD-10-CM | POA: Diagnosis not present

## 2022-11-16 DIAGNOSIS — E039 Hypothyroidism, unspecified: Secondary | ICD-10-CM | POA: Diagnosis not present

## 2022-11-16 DIAGNOSIS — J449 Chronic obstructive pulmonary disease, unspecified: Secondary | ICD-10-CM | POA: Diagnosis not present

## 2022-11-16 DIAGNOSIS — B351 Tinea unguium: Secondary | ICD-10-CM | POA: Diagnosis not present

## 2022-11-16 DIAGNOSIS — J45909 Unspecified asthma, uncomplicated: Secondary | ICD-10-CM | POA: Diagnosis not present

## 2022-11-16 DIAGNOSIS — K219 Gastro-esophageal reflux disease without esophagitis: Secondary | ICD-10-CM | POA: Diagnosis not present

## 2022-11-23 DIAGNOSIS — E782 Mixed hyperlipidemia: Secondary | ICD-10-CM | POA: Diagnosis not present

## 2022-11-23 DIAGNOSIS — E119 Type 2 diabetes mellitus without complications: Secondary | ICD-10-CM | POA: Diagnosis not present

## 2022-11-23 DIAGNOSIS — D518 Other vitamin B12 deficiency anemias: Secondary | ICD-10-CM | POA: Diagnosis not present

## 2022-11-23 DIAGNOSIS — Z79899 Other long term (current) drug therapy: Secondary | ICD-10-CM | POA: Diagnosis not present

## 2022-11-23 DIAGNOSIS — E038 Other specified hypothyroidism: Secondary | ICD-10-CM | POA: Diagnosis not present

## 2022-11-28 DIAGNOSIS — E782 Mixed hyperlipidemia: Secondary | ICD-10-CM | POA: Diagnosis not present

## 2022-11-28 DIAGNOSIS — J449 Chronic obstructive pulmonary disease, unspecified: Secondary | ICD-10-CM | POA: Diagnosis not present

## 2022-11-28 DIAGNOSIS — E119 Type 2 diabetes mellitus without complications: Secondary | ICD-10-CM | POA: Diagnosis not present

## 2022-11-28 DIAGNOSIS — E559 Vitamin D deficiency, unspecified: Secondary | ICD-10-CM | POA: Diagnosis not present

## 2022-11-28 DIAGNOSIS — E038 Other specified hypothyroidism: Secondary | ICD-10-CM | POA: Diagnosis not present

## 2022-11-28 DIAGNOSIS — G309 Alzheimer's disease, unspecified: Secondary | ICD-10-CM | POA: Diagnosis not present

## 2022-11-28 DIAGNOSIS — I10 Essential (primary) hypertension: Secondary | ICD-10-CM | POA: Diagnosis not present

## 2022-11-28 DIAGNOSIS — D518 Other vitamin B12 deficiency anemias: Secondary | ICD-10-CM | POA: Diagnosis not present

## 2022-11-28 DIAGNOSIS — M810 Age-related osteoporosis without current pathological fracture: Secondary | ICD-10-CM | POA: Diagnosis not present

## 2022-12-14 DIAGNOSIS — E039 Hypothyroidism, unspecified: Secondary | ICD-10-CM | POA: Diagnosis not present

## 2022-12-14 DIAGNOSIS — L853 Xerosis cutis: Secondary | ICD-10-CM | POA: Diagnosis not present

## 2022-12-14 DIAGNOSIS — I10 Essential (primary) hypertension: Secondary | ICD-10-CM | POA: Diagnosis not present

## 2022-12-14 DIAGNOSIS — E559 Vitamin D deficiency, unspecified: Secondary | ICD-10-CM | POA: Diagnosis not present

## 2022-12-14 DIAGNOSIS — J449 Chronic obstructive pulmonary disease, unspecified: Secondary | ICD-10-CM | POA: Diagnosis not present

## 2022-12-14 DIAGNOSIS — E782 Mixed hyperlipidemia: Secondary | ICD-10-CM | POA: Diagnosis not present

## 2022-12-14 DIAGNOSIS — K219 Gastro-esophageal reflux disease without esophagitis: Secondary | ICD-10-CM | POA: Diagnosis not present

## 2022-12-14 DIAGNOSIS — M810 Age-related osteoporosis without current pathological fracture: Secondary | ICD-10-CM | POA: Diagnosis not present

## 2022-12-14 DIAGNOSIS — J45909 Unspecified asthma, uncomplicated: Secondary | ICD-10-CM | POA: Diagnosis not present

## 2022-12-24 DIAGNOSIS — G309 Alzheimer's disease, unspecified: Secondary | ICD-10-CM | POA: Diagnosis not present

## 2022-12-24 DIAGNOSIS — E559 Vitamin D deficiency, unspecified: Secondary | ICD-10-CM | POA: Diagnosis not present

## 2022-12-24 DIAGNOSIS — M810 Age-related osteoporosis without current pathological fracture: Secondary | ICD-10-CM | POA: Diagnosis not present

## 2022-12-24 DIAGNOSIS — E7849 Other hyperlipidemia: Secondary | ICD-10-CM | POA: Diagnosis not present

## 2022-12-24 DIAGNOSIS — J449 Chronic obstructive pulmonary disease, unspecified: Secondary | ICD-10-CM | POA: Diagnosis not present

## 2022-12-24 DIAGNOSIS — E038 Other specified hypothyroidism: Secondary | ICD-10-CM | POA: Diagnosis not present

## 2022-12-24 DIAGNOSIS — I10 Essential (primary) hypertension: Secondary | ICD-10-CM | POA: Diagnosis not present

## 2022-12-24 DIAGNOSIS — E119 Type 2 diabetes mellitus without complications: Secondary | ICD-10-CM | POA: Diagnosis not present

## 2022-12-24 DIAGNOSIS — D518 Other vitamin B12 deficiency anemias: Secondary | ICD-10-CM | POA: Diagnosis not present

## 2023-01-18 DIAGNOSIS — L853 Xerosis cutis: Secondary | ICD-10-CM | POA: Diagnosis not present

## 2023-01-18 DIAGNOSIS — E039 Hypothyroidism, unspecified: Secondary | ICD-10-CM | POA: Diagnosis not present

## 2023-01-18 DIAGNOSIS — J45909 Unspecified asthma, uncomplicated: Secondary | ICD-10-CM | POA: Diagnosis not present

## 2023-01-18 DIAGNOSIS — J449 Chronic obstructive pulmonary disease, unspecified: Secondary | ICD-10-CM | POA: Diagnosis not present

## 2023-01-18 DIAGNOSIS — G309 Alzheimer's disease, unspecified: Secondary | ICD-10-CM | POA: Diagnosis not present

## 2023-01-18 DIAGNOSIS — E782 Mixed hyperlipidemia: Secondary | ICD-10-CM | POA: Diagnosis not present

## 2023-01-18 DIAGNOSIS — R058 Other specified cough: Secondary | ICD-10-CM | POA: Diagnosis not present

## 2023-01-18 DIAGNOSIS — I10 Essential (primary) hypertension: Secondary | ICD-10-CM | POA: Diagnosis not present

## 2023-01-18 DIAGNOSIS — J302 Other seasonal allergic rhinitis: Secondary | ICD-10-CM | POA: Diagnosis not present

## 2023-01-21 DIAGNOSIS — I10 Essential (primary) hypertension: Secondary | ICD-10-CM | POA: Diagnosis not present

## 2023-01-21 DIAGNOSIS — E119 Type 2 diabetes mellitus without complications: Secondary | ICD-10-CM | POA: Diagnosis not present

## 2023-01-21 DIAGNOSIS — J449 Chronic obstructive pulmonary disease, unspecified: Secondary | ICD-10-CM | POA: Diagnosis not present

## 2023-01-21 DIAGNOSIS — E559 Vitamin D deficiency, unspecified: Secondary | ICD-10-CM | POA: Diagnosis not present

## 2023-01-21 DIAGNOSIS — D518 Other vitamin B12 deficiency anemias: Secondary | ICD-10-CM | POA: Diagnosis not present

## 2023-01-21 DIAGNOSIS — E7849 Other hyperlipidemia: Secondary | ICD-10-CM | POA: Diagnosis not present

## 2023-01-21 DIAGNOSIS — M810 Age-related osteoporosis without current pathological fracture: Secondary | ICD-10-CM | POA: Diagnosis not present

## 2023-01-21 DIAGNOSIS — G309 Alzheimer's disease, unspecified: Secondary | ICD-10-CM | POA: Diagnosis not present

## 2023-01-21 DIAGNOSIS — E038 Other specified hypothyroidism: Secondary | ICD-10-CM | POA: Diagnosis not present

## 2023-02-14 DIAGNOSIS — E559 Vitamin D deficiency, unspecified: Secondary | ICD-10-CM | POA: Diagnosis not present

## 2023-02-14 DIAGNOSIS — D518 Other vitamin B12 deficiency anemias: Secondary | ICD-10-CM | POA: Diagnosis not present

## 2023-02-14 DIAGNOSIS — Z79899 Other long term (current) drug therapy: Secondary | ICD-10-CM | POA: Diagnosis not present

## 2023-02-14 DIAGNOSIS — E038 Other specified hypothyroidism: Secondary | ICD-10-CM | POA: Diagnosis not present

## 2023-02-14 DIAGNOSIS — E119 Type 2 diabetes mellitus without complications: Secondary | ICD-10-CM | POA: Diagnosis not present

## 2023-02-14 DIAGNOSIS — E782 Mixed hyperlipidemia: Secondary | ICD-10-CM | POA: Diagnosis not present

## 2023-02-15 DIAGNOSIS — K219 Gastro-esophageal reflux disease without esophagitis: Secondary | ICD-10-CM | POA: Diagnosis not present

## 2023-02-15 DIAGNOSIS — M810 Age-related osteoporosis without current pathological fracture: Secondary | ICD-10-CM | POA: Diagnosis not present

## 2023-02-15 DIAGNOSIS — L853 Xerosis cutis: Secondary | ICD-10-CM | POA: Diagnosis not present

## 2023-02-15 DIAGNOSIS — G309 Alzheimer's disease, unspecified: Secondary | ICD-10-CM | POA: Diagnosis not present

## 2023-02-15 DIAGNOSIS — E039 Hypothyroidism, unspecified: Secondary | ICD-10-CM | POA: Diagnosis not present

## 2023-02-15 DIAGNOSIS — J449 Chronic obstructive pulmonary disease, unspecified: Secondary | ICD-10-CM | POA: Diagnosis not present

## 2023-02-15 DIAGNOSIS — J45909 Unspecified asthma, uncomplicated: Secondary | ICD-10-CM | POA: Diagnosis not present

## 2023-02-15 DIAGNOSIS — I10 Essential (primary) hypertension: Secondary | ICD-10-CM | POA: Diagnosis not present

## 2023-02-15 DIAGNOSIS — E782 Mixed hyperlipidemia: Secondary | ICD-10-CM | POA: Diagnosis not present

## 2023-02-25 DIAGNOSIS — E559 Vitamin D deficiency, unspecified: Secondary | ICD-10-CM | POA: Diagnosis not present

## 2023-02-25 DIAGNOSIS — M810 Age-related osteoporosis without current pathological fracture: Secondary | ICD-10-CM | POA: Diagnosis not present

## 2023-02-25 DIAGNOSIS — D518 Other vitamin B12 deficiency anemias: Secondary | ICD-10-CM | POA: Diagnosis not present

## 2023-02-25 DIAGNOSIS — E119 Type 2 diabetes mellitus without complications: Secondary | ICD-10-CM | POA: Diagnosis not present

## 2023-02-25 DIAGNOSIS — E7849 Other hyperlipidemia: Secondary | ICD-10-CM | POA: Diagnosis not present

## 2023-02-25 DIAGNOSIS — E038 Other specified hypothyroidism: Secondary | ICD-10-CM | POA: Diagnosis not present

## 2023-02-25 DIAGNOSIS — G309 Alzheimer's disease, unspecified: Secondary | ICD-10-CM | POA: Diagnosis not present

## 2023-02-25 DIAGNOSIS — I10 Essential (primary) hypertension: Secondary | ICD-10-CM | POA: Diagnosis not present

## 2023-02-25 DIAGNOSIS — J449 Chronic obstructive pulmonary disease, unspecified: Secondary | ICD-10-CM | POA: Diagnosis not present

## 2023-02-27 DIAGNOSIS — L600 Ingrowing nail: Secondary | ICD-10-CM | POA: Diagnosis not present

## 2023-02-27 DIAGNOSIS — E1140 Type 2 diabetes mellitus with diabetic neuropathy, unspecified: Secondary | ICD-10-CM | POA: Diagnosis not present

## 2023-02-27 DIAGNOSIS — M79671 Pain in right foot: Secondary | ICD-10-CM | POA: Diagnosis not present

## 2023-02-27 DIAGNOSIS — B351 Tinea unguium: Secondary | ICD-10-CM | POA: Diagnosis not present

## 2023-02-27 DIAGNOSIS — M79672 Pain in left foot: Secondary | ICD-10-CM | POA: Diagnosis not present

## 2023-03-15 DIAGNOSIS — K219 Gastro-esophageal reflux disease without esophagitis: Secondary | ICD-10-CM | POA: Diagnosis not present

## 2023-03-15 DIAGNOSIS — L853 Xerosis cutis: Secondary | ICD-10-CM | POA: Diagnosis not present

## 2023-03-15 DIAGNOSIS — E039 Hypothyroidism, unspecified: Secondary | ICD-10-CM | POA: Diagnosis not present

## 2023-03-15 DIAGNOSIS — I10 Essential (primary) hypertension: Secondary | ICD-10-CM | POA: Diagnosis not present

## 2023-03-15 DIAGNOSIS — J45909 Unspecified asthma, uncomplicated: Secondary | ICD-10-CM | POA: Diagnosis not present

## 2023-03-15 DIAGNOSIS — J449 Chronic obstructive pulmonary disease, unspecified: Secondary | ICD-10-CM | POA: Diagnosis not present

## 2023-03-15 DIAGNOSIS — M810 Age-related osteoporosis without current pathological fracture: Secondary | ICD-10-CM | POA: Diagnosis not present

## 2023-03-15 DIAGNOSIS — E782 Mixed hyperlipidemia: Secondary | ICD-10-CM | POA: Diagnosis not present

## 2023-03-15 DIAGNOSIS — G309 Alzheimer's disease, unspecified: Secondary | ICD-10-CM | POA: Diagnosis not present
